# Patient Record
Sex: Female | Born: 1935 | Race: White | Hispanic: No | Smoking: Never smoker
Health system: Southern US, Community
[De-identification: ages and names within clinical notes are randomized; demographics above are authoritative.]

## PROBLEM LIST (undated history)

## (undated) DIAGNOSIS — I10 Essential (primary) hypertension: Secondary | ICD-10-CM

## (undated) DIAGNOSIS — F0280 Dementia in other diseases classified elsewhere without behavioral disturbance: Secondary | ICD-10-CM

## (undated) DIAGNOSIS — E785 Hyperlipidemia, unspecified: Secondary | ICD-10-CM

## (undated) DIAGNOSIS — G309 Alzheimer's disease, unspecified: Secondary | ICD-10-CM

## (undated) DIAGNOSIS — I4891 Unspecified atrial fibrillation: Secondary | ICD-10-CM

## (undated) HISTORY — PX: CORNEAL TRANSPLANT: SHX108

---

## 2013-05-29 ENCOUNTER — Encounter (INDEPENDENT_AMBULATORY_CARE_PROVIDER_SITE_OTHER): Payer: Medicare Other | Admitting: Ophthalmology

## 2013-05-29 ENCOUNTER — Encounter (INDEPENDENT_AMBULATORY_CARE_PROVIDER_SITE_OTHER): Payer: Self-pay | Admitting: Ophthalmology

## 2013-05-29 DIAGNOSIS — I10 Essential (primary) hypertension: Secondary | ICD-10-CM

## 2013-05-29 DIAGNOSIS — H35039 Hypertensive retinopathy, unspecified eye: Secondary | ICD-10-CM

## 2013-05-29 DIAGNOSIS — H1810 Bullous keratopathy, unspecified eye: Secondary | ICD-10-CM

## 2013-05-29 DIAGNOSIS — H43819 Vitreous degeneration, unspecified eye: Secondary | ICD-10-CM

## 2013-05-29 DIAGNOSIS — H3530 Unspecified macular degeneration: Secondary | ICD-10-CM

## 2013-06-17 ENCOUNTER — Inpatient Hospital Stay: Payer: Self-pay | Admitting: Specialist

## 2013-06-17 LAB — CBC WITH DIFFERENTIAL/PLATELET
Basophil #: 0.1 10*3/uL (ref 0.0–0.1)
Basophil %: 0.5 %
Eosinophil #: 0 10*3/uL (ref 0.0–0.7)
Eosinophil %: 0 %
HCT: 31.7 % — ABNORMAL LOW (ref 35.0–47.0)
HGB: 10.3 g/dL — AB (ref 12.0–16.0)
Lymphocyte #: 0.7 10*3/uL — ABNORMAL LOW (ref 1.0–3.6)
Lymphocyte %: 4.1 %
MCH: 34 pg (ref 26.0–34.0)
MCHC: 32.6 g/dL (ref 32.0–36.0)
MCV: 104 fL — AB (ref 80–100)
MONO ABS: 1.4 x10 3/mm — AB (ref 0.2–0.9)
Monocyte %: 8.1 %
Neutrophil #: 15.1 10*3/uL — ABNORMAL HIGH (ref 1.4–6.5)
Neutrophil %: 87.3 %
Platelet: 264 10*3/uL (ref 150–440)
RBC: 3.04 10*6/uL — AB (ref 3.80–5.20)
RDW: 21.6 % — ABNORMAL HIGH (ref 11.5–14.5)
WBC: 17.3 10*3/uL — ABNORMAL HIGH (ref 3.6–11.0)

## 2013-06-17 LAB — URINALYSIS, COMPLETE
BACTERIA: NONE SEEN
BILIRUBIN, UR: NEGATIVE
GLUCOSE, UR: NEGATIVE mg/dL (ref 0–75)
Hyaline Cast: 2
Ketone: NEGATIVE
Leukocyte Esterase: NEGATIVE
Nitrite: NEGATIVE
PH: 6 (ref 4.5–8.0)
Protein: 30
RBC,UR: 7 /HPF (ref 0–5)
Specific Gravity: 1.016 (ref 1.003–1.030)
Squamous Epithelial: <1

## 2013-06-17 LAB — COMPREHENSIVE METABOLIC PANEL
ALT: 9 U/L — AB (ref 12–78)
Albumin: 2.8 g/dL — ABNORMAL LOW (ref 3.4–5.0)
Alkaline Phosphatase: 46 U/L
Anion Gap: 5 — ABNORMAL LOW (ref 7–16)
BUN: 29 mg/dL — ABNORMAL HIGH (ref 7–18)
Bilirubin,Total: 1.3 mg/dL — ABNORMAL HIGH (ref 0.2–1.0)
Calcium, Total: 8.7 mg/dL (ref 8.5–10.1)
Chloride: 98 mmol/L (ref 98–107)
Co2: 33 mmol/L — ABNORMAL HIGH (ref 21–32)
Creatinine: 1.3 mg/dL (ref 0.60–1.30)
GFR CALC AF AMER: 40 — AB
GFR CALC NON AF AMER: 34 — AB
Glucose: 176 mg/dL — ABNORMAL HIGH (ref 65–99)
OSMOLALITY: 282 (ref 275–301)
Potassium: 3.3 mmol/L — ABNORMAL LOW (ref 3.5–5.1)
SGOT(AST): 16 U/L (ref 15–37)
Sodium: 136 mmol/L (ref 136–145)
Total Protein: 6.4 g/dL (ref 6.4–8.2)

## 2013-06-17 LAB — TSH: Thyroid Stimulating Horm: 1.6 u[IU]/mL

## 2013-06-17 LAB — HEMOGLOBIN A1C: Hemoglobin A1C: 6.6 % — ABNORMAL HIGH (ref 4.2–6.3)

## 2013-06-17 LAB — BILIRUBIN, DIRECT: BILIRUBIN DIRECT: 0.3 mg/dL — AB (ref 0.00–0.20)

## 2013-06-17 LAB — MAGNESIUM: MAGNESIUM: 1.9 mg/dL

## 2013-06-17 LAB — AMMONIA: Ammonia, Plasma: <10 mcmol/L (ref 11–32)

## 2013-06-17 LAB — TROPONIN I: Troponin-I: <0.02 ng/mL

## 2013-06-18 LAB — COMPREHENSIVE METABOLIC PANEL
ALBUMIN: 2.7 g/dL — AB (ref 3.4–5.0)
AST: 25 U/L (ref 15–37)
Alkaline Phosphatase: 46 U/L
Anion Gap: 5 — ABNORMAL LOW (ref 7–16)
BUN: 28 mg/dL — ABNORMAL HIGH (ref 7–18)
Bilirubin,Total: 1.2 mg/dL — ABNORMAL HIGH (ref 0.2–1.0)
CREATININE: 1.22 mg/dL (ref 0.60–1.30)
Calcium, Total: 8.6 mg/dL (ref 8.5–10.1)
Chloride: 103 mmol/L (ref 98–107)
Co2: 32 mmol/L (ref 21–32)
EGFR (Non-African Amer.): 37 — ABNORMAL LOW
GFR CALC AF AMER: 43 — AB
GLUCOSE: 119 mg/dL — AB (ref 65–99)
OSMOLALITY: 286 (ref 275–301)
Potassium: 3.9 mmol/L (ref 3.5–5.1)
SGPT (ALT): 13 U/L (ref 12–78)
Sodium: 140 mmol/L (ref 136–145)
Total Protein: 6.7 g/dL (ref 6.4–8.2)

## 2013-06-18 LAB — CBC WITH DIFFERENTIAL/PLATELET
Basophil #: 0.1 10*3/uL (ref 0.0–0.1)
Basophil %: 0.4 %
EOS PCT: 0.1 %
Eosinophil #: 0 10*3/uL (ref 0.0–0.7)
HCT: 32 % — ABNORMAL LOW (ref 35.0–47.0)
HGB: 10.7 g/dL — ABNORMAL LOW (ref 12.0–16.0)
LYMPHS PCT: 5.2 %
Lymphocyte #: 0.8 10*3/uL — ABNORMAL LOW (ref 1.0–3.6)
MCH: 35.2 pg — AB (ref 26.0–34.0)
MCHC: 33.4 g/dL (ref 32.0–36.0)
MCV: 105 fL — ABNORMAL HIGH (ref 80–100)
Monocyte #: 1.4 x10 3/mm — ABNORMAL HIGH (ref 0.2–0.9)
Monocyte %: 8.9 %
NEUTROS ABS: 13.2 10*3/uL — AB (ref 1.4–6.5)
Neutrophil %: 85.4 %
Platelet: 257 10*3/uL (ref 150–440)
RBC: 3.04 10*6/uL — ABNORMAL LOW (ref 3.80–5.20)
RDW: 21.3 % — ABNORMAL HIGH (ref 11.5–14.5)
WBC: 15.4 10*3/uL — ABNORMAL HIGH (ref 3.6–11.0)

## 2013-06-19 LAB — BASIC METABOLIC PANEL
Anion Gap: 3 — ABNORMAL LOW (ref 7–16)
BUN: 31 mg/dL — AB (ref 7–18)
Calcium, Total: 8.7 mg/dL (ref 8.5–10.1)
Chloride: 106 mmol/L (ref 98–107)
Co2: 32 mmol/L (ref 21–32)
Creatinine: 1.29 mg/dL (ref 0.60–1.30)
EGFR (Non-African Amer.): 34 — ABNORMAL LOW
GFR CALC AF AMER: 40 — AB
GLUCOSE: 103 mg/dL — AB (ref 65–99)
Osmolality: 288 (ref 275–301)
Potassium: 4.1 mmol/L (ref 3.5–5.1)
Sodium: 141 mmol/L (ref 136–145)

## 2013-06-19 LAB — CBC WITH DIFFERENTIAL/PLATELET
BASOS PCT: 0.5 %
Basophil #: 0.1 10*3/uL (ref 0.0–0.1)
EOS ABS: 0.1 10*3/uL (ref 0.0–0.7)
Eosinophil %: 0.7 %
HCT: 29 % — AB (ref 35.0–47.0)
HGB: 9.7 g/dL — ABNORMAL LOW (ref 12.0–16.0)
Lymphocyte #: 1.1 10*3/uL (ref 1.0–3.6)
Lymphocyte %: 10.3 %
MCH: 34.9 pg — ABNORMAL HIGH (ref 26.0–34.0)
MCHC: 33.3 g/dL (ref 32.0–36.0)
MCV: 105 fL — AB (ref 80–100)
Monocyte #: 1 x10 3/mm — ABNORMAL HIGH (ref 0.2–0.9)
Monocyte %: 9.5 %
Neutrophil #: 8.5 10*3/uL — ABNORMAL HIGH (ref 1.4–6.5)
Neutrophil %: 79 %
Platelet: 258 10*3/uL (ref 150–440)
RBC: 2.76 10*6/uL — ABNORMAL LOW (ref 3.80–5.20)
RDW: 20.7 % — AB (ref 11.5–14.5)
WBC: 10.8 10*3/uL (ref 3.6–11.0)

## 2013-06-19 LAB — URINE CULTURE

## 2013-06-22 LAB — CULTURE, BLOOD (SINGLE)

## 2013-09-19 ENCOUNTER — Emergency Department (HOSPITAL_COMMUNITY): Payer: Medicare Other

## 2013-09-19 ENCOUNTER — Encounter (HOSPITAL_COMMUNITY): Payer: Self-pay | Admitting: Emergency Medicine

## 2013-09-19 DIAGNOSIS — F09 Unspecified mental disorder due to known physiological condition: Secondary | ICD-10-CM | POA: Diagnosis present

## 2013-09-19 DIAGNOSIS — F0280 Dementia in other diseases classified elsewhere without behavioral disturbance: Secondary | ICD-10-CM | POA: Diagnosis present

## 2013-09-19 DIAGNOSIS — E785 Hyperlipidemia, unspecified: Secondary | ICD-10-CM | POA: Diagnosis present

## 2013-09-19 DIAGNOSIS — I4891 Unspecified atrial fibrillation: Secondary | ICD-10-CM | POA: Diagnosis present

## 2013-09-19 DIAGNOSIS — I359 Nonrheumatic aortic valve disorder, unspecified: Secondary | ICD-10-CM | POA: Diagnosis present

## 2013-09-19 DIAGNOSIS — I2109 ST elevation (STEMI) myocardial infarction involving other coronary artery of anterior wall: Secondary | ICD-10-CM | POA: Diagnosis not present

## 2013-09-19 DIAGNOSIS — Z515 Encounter for palliative care: Secondary | ICD-10-CM | POA: Diagnosis not present

## 2013-09-19 DIAGNOSIS — I5043 Acute on chronic combined systolic (congestive) and diastolic (congestive) heart failure: Secondary | ICD-10-CM | POA: Diagnosis present

## 2013-09-19 DIAGNOSIS — Z66 Do not resuscitate: Secondary | ICD-10-CM | POA: Diagnosis present

## 2013-09-19 DIAGNOSIS — I509 Heart failure, unspecified: Secondary | ICD-10-CM | POA: Diagnosis present

## 2013-09-19 DIAGNOSIS — F0390 Unspecified dementia without behavioral disturbance: Secondary | ICD-10-CM | POA: Diagnosis present

## 2013-09-19 DIAGNOSIS — I10 Essential (primary) hypertension: Secondary | ICD-10-CM | POA: Diagnosis present

## 2013-09-19 DIAGNOSIS — I255 Ischemic cardiomyopathy: Secondary | ICD-10-CM

## 2013-09-19 DIAGNOSIS — G309 Alzheimer's disease, unspecified: Secondary | ICD-10-CM | POA: Diagnosis present

## 2013-09-19 DIAGNOSIS — I2589 Other forms of chronic ischemic heart disease: Secondary | ICD-10-CM | POA: Diagnosis present

## 2013-09-19 DIAGNOSIS — G9340 Encephalopathy, unspecified: Secondary | ICD-10-CM | POA: Diagnosis present

## 2013-09-19 DIAGNOSIS — I2789 Other specified pulmonary heart diseases: Secondary | ICD-10-CM | POA: Diagnosis present

## 2013-09-19 HISTORY — DX: Essential (primary) hypertension: I10

## 2013-09-19 HISTORY — DX: Dementia in other diseases classified elsewhere, unspecified severity, without behavioral disturbance, psychotic disturbance, mood disturbance, and anxiety: F02.80

## 2013-09-19 HISTORY — DX: Unspecified atrial fibrillation: I48.91

## 2013-09-19 HISTORY — DX: Alzheimer's disease, unspecified: G30.9

## 2013-09-19 HISTORY — DX: Hyperlipidemia, unspecified: E78.5

## 2013-09-19 LAB — CBC WITH DIFFERENTIAL/PLATELET
BASOS PCT: 0 % (ref 0–1)
Basophils Absolute: 0 10*3/uL (ref 0.0–0.1)
EOS ABS: 0.2 10*3/uL (ref 0.0–0.7)
EOS PCT: 2 % (ref 0–5)
HCT: 28.7 % — ABNORMAL LOW (ref 36.0–46.0)
Hemoglobin: 9.5 g/dL — ABNORMAL LOW (ref 12.0–15.0)
Lymphocytes Relative: 12 % (ref 12–46)
Lymphs Abs: 1.3 10*3/uL (ref 0.7–4.0)
MCH: 33.9 pg (ref 26.0–34.0)
MCHC: 33.1 g/dL (ref 30.0–36.0)
MCV: 102.5 fL — ABNORMAL HIGH (ref 78.0–100.0)
Monocytes Absolute: 1.5 10*3/uL — ABNORMAL HIGH (ref 0.1–1.0)
Monocytes Relative: 14 % — ABNORMAL HIGH (ref 3–12)
Neutro Abs: 7.4 10*3/uL (ref 1.7–7.7)
Neutrophils Relative %: 72 % (ref 43–77)
PLATELETS: 337 10*3/uL (ref 150–400)
RBC: 2.8 MIL/uL — AB (ref 3.87–5.11)
RDW: 20.7 % — ABNORMAL HIGH (ref 11.5–15.5)
WBC: 10.4 10*3/uL (ref 4.0–10.5)

## 2013-09-19 LAB — I-STAT CHEM 8, ED
BUN: 28 mg/dL — ABNORMAL HIGH (ref 6–23)
CALCIUM ION: 1.12 mmol/L — AB (ref 1.13–1.30)
CREATININE: 1.4 mg/dL — AB (ref 0.50–1.10)
Chloride: 104 mEq/L (ref 96–112)
GLUCOSE: 136 mg/dL — AB (ref 70–99)
HCT: 30 % — ABNORMAL LOW (ref 36.0–46.0)
HEMOGLOBIN: 10.2 g/dL — AB (ref 12.0–15.0)
Potassium: 4.3 mEq/L (ref 3.7–5.3)
SODIUM: 136 meq/L — AB (ref 137–147)
TCO2: 29 mmol/L (ref 0–100)

## 2013-09-19 LAB — BASIC METABOLIC PANEL
Anion gap: 12 (ref 5–15)
BUN: 24 mg/dL — ABNORMAL HIGH (ref 6–23)
CALCIUM: 8.8 mg/dL (ref 8.4–10.5)
CO2: 25 mEq/L (ref 19–32)
Chloride: 101 mEq/L (ref 96–112)
Creatinine, Ser: 1.29 mg/dL — ABNORMAL HIGH (ref 0.50–1.10)
GFR calc Af Amer: 39 mL/min — ABNORMAL LOW (ref >90)
GFR, EST NON AFRICAN AMERICAN: 33 mL/min — AB (ref >90)
Glucose, Bld: 133 mg/dL — ABNORMAL HIGH (ref 70–99)
Potassium: 4.4 mEq/L (ref 3.7–5.3)
SODIUM: 138 meq/L (ref 137–147)

## 2013-09-19 LAB — TROPONIN I: TROPONIN I: 14.19 ng/mL — AB (ref <0.30)

## 2013-09-19 LAB — I-STAT TROPONIN, ED: Troponin i, poc: 0.02 ng/mL (ref 0.00–0.08)

## 2013-09-19 LAB — MRSA PCR SCREENING: MRSA by PCR: NEGATIVE

## 2013-09-19 LAB — PROTIME-INR
INR: 1.3 (ref 0.00–1.49)
Prothrombin Time: 16.2 seconds — ABNORMAL HIGH (ref 11.6–15.2)

## 2013-09-19 MED ORDER — ATORVASTATIN CALCIUM 10 MG PO TABS
10.0000 mg | ORAL_TABLET | Freq: Every day | ORAL | Status: DC
  Administered 2013-09-19 – 2013-09-20 (×2): 10 mg via ORAL
  Filled 2013-09-19 (×3): qty 1

## 2013-09-19 MED ORDER — CLOPIDOGREL BISULFATE 75 MG PO TABS
75.0000 mg | ORAL_TABLET | Freq: Every day | ORAL | Status: DC
  Administered 2013-09-20 – 2013-09-21 (×2): 75 mg via ORAL
  Filled 2013-09-19 (×3): qty 1

## 2013-09-19 MED ORDER — ASPIRIN 300 MG RE SUPP
300.0000 mg | RECTAL | Status: AC
  Filled 2013-09-19: qty 1

## 2013-09-19 MED ORDER — ONDANSETRON HCL 4 MG/2ML IJ SOLN
4.0000 mg | Freq: Four times a day (QID) | INTRAMUSCULAR | Status: DC | PRN
  Administered 2013-09-19: 4 mg via INTRAVENOUS
  Filled 2013-09-19: qty 2

## 2013-09-19 MED ORDER — LEVOTHYROXINE SODIUM 50 MCG PO TABS
50.0000 ug | ORAL_TABLET | Freq: Every day | ORAL | Status: DC
  Administered 2013-09-21: 50 ug via ORAL
  Filled 2013-09-19 (×3): qty 1

## 2013-09-19 MED ORDER — NITROGLYCERIN 0.4 MG SL SUBL: 0.4000 mg | SUBLINGUAL_TABLET | SUBLINGUAL | Status: DC | PRN

## 2013-09-19 MED ORDER — ASPIRIN 81 MG PO CHEW
324.0000 mg | CHEWABLE_TABLET | ORAL | Status: AC
  Administered 2013-09-19: 243 mg via ORAL
  Filled 2013-09-19 (×2): qty 4

## 2013-09-19 MED ORDER — ENOXAPARIN SODIUM 60 MG/0.6ML ~~LOC~~ SOLN
50.0000 mg | SUBCUTANEOUS | Status: DC
  Administered 2013-09-19 – 2013-09-21 (×3): 50 mg via SUBCUTANEOUS
  Filled 2013-09-19 (×3): qty 0.6

## 2013-09-19 MED ORDER — ONDANSETRON 4 MG PO TBDP: 4.0000 mg | ORAL_TABLET | Freq: Once | ORAL | Status: DC

## 2013-09-19 MED ORDER — ASPIRIN EC 81 MG PO TBEC
81.0000 mg | DELAYED_RELEASE_TABLET | Freq: Every day | ORAL | Status: DC
  Administered 2013-09-20 – 2013-09-21 (×2): 81 mg via ORAL
  Filled 2013-09-19 (×2): qty 1

## 2013-09-19 MED ORDER — CETYLPYRIDINIUM CHLORIDE 0.05 % MT LIQD
7.0000 mL | Freq: Two times a day (BID) | OROMUCOSAL | Status: DC
  Administered 2013-09-19 – 2013-09-21 (×3): 7 mL via OROMUCOSAL

## 2013-09-19 MED ORDER — MORPHINE SULFATE 2 MG/ML IJ SOLN
2.0000 mg | INTRAMUSCULAR | Status: AC | PRN
  Administered 2013-09-19 – 2013-09-20 (×3): 2 mg via INTRAVENOUS
  Filled 2013-09-19 (×4): qty 1

## 2013-09-19 MED ORDER — ACETAMINOPHEN 325 MG PO TABS: 650.0000 mg | ORAL_TABLET | ORAL | Status: DC | PRN

## 2013-09-19 MED ORDER — METOPROLOL TARTRATE 12.5 MG HALF TABLET
12.5000 mg | ORAL_TABLET | Freq: Two times a day (BID) | ORAL | Status: DC
  Administered 2013-09-19 – 2013-09-20 (×3): 12.5 mg via ORAL
  Filled 2013-09-19 (×6): qty 1

## 2013-09-19 MED ORDER — CLOPIDOGREL BISULFATE 300 MG PO TABS
300.0000 mg | ORAL_TABLET | Freq: Once | ORAL | Status: AC
  Administered 2013-09-19: 300 mg via ORAL
  Filled 2013-09-19: qty 1

## 2013-09-19 NOTE — ED Notes (Signed)
Dr. Excell Seltzer at the bedside talking family.

## 2013-09-19 NOTE — ED Notes (Addendum)
Pt to department via EMS from [facility]- Pt reports that she started having midsternal chest pain this morning. Rates the pain 10/10. Pt had 1 ASA this morning prior to EMS arrival. Pt given 1 nitro en route. Pt with hx of a-fib. Bp-131/65 Hr-60 18g LAC Pt given of fentanyl

## 2013-09-19 NOTE — Progress Notes (Signed)
ANTICOAGULATION CONSULT NOTE - Initial Consult  Pharmacy Consult for Lovenox Indication: chest pain/ACS  Allergies  Allergen Reactions  . Codeine Other (See Comments)    Unknown; listed on [facility]    Patient Measurements: Weight: 116 lb 6.5 oz (52.8 kg)  Vital Signs: Temp: 97.7 F (36.5 C) (08/29 1320) Temp src: Oral (08/29 1320) BP: 138/63 mmHg (08/29 1320) Pulse Rate: 93 (08/29 1320)  Labs:  Recent Labs  10/11/13 1143 2013-10-11 1152  HGB 9.5* 10.2*  HCT 28.7* 30.0*  PLT 337  --   LABPROT 16.2*  --   INR 1.30  --   CREATININE 1.29* 1.40*    CrCl is unknown because there is no height on file for the current visit.   Medical History: Past Medical History  Diagnosis Date  . Atrial fibrillation   . Alzheimer disease   . Hyperlipemia   . Hypertension     Medications:  Scheduled:  . aspirin  324 mg Oral NOW   Or  . aspirin  300 mg Rectal NOW  . [START ON 09/20/2013] aspirin EC  81 mg Oral Daily  . atorvastatin  10 mg Oral q1800  . clopidogrel  300 mg Oral Once  . [START ON 09/20/2013] clopidogrel  75 mg Oral Q breakfast  . metoprolol tartrate  12.5 mg Oral BID  . ondansetron  4 mg Oral Once   Assessment: 80 yo F presenting on 8/29 with CP and not appropriate candidate for cath. She is now to start therapeutic Lovenox. Weight 52.8 kg, SCr 1.40 (CrCl ~19 ml/min), H/H low, plt wnl, no reported s/s bleeding.   Goal of Therapy:  Anti-Xa level 0.6-1 units/ml 4hrs after LMWH dose given Monitor platelets by anticoagulation protocol: Yes   Plan:  - Start Lovenox 50 mg (1mg /kg) SQ q24h (reduced dose due to renal function) - Monitor renal function, CBC, s/s bleeding  Margie Billet, PharmD Clinical Pharmacist - Resident Pager: [phone] Pharmacy: [phone] 10/11/2013 1:34 PM

## 2013-09-19 NOTE — Progress Notes (Signed)
CRITICAL VALUE ALERT  Critical value received:  Troponin  Date of notification:  09/11/2013  Time of notification:  1950  Critical value read back:Yes.    Nurse who received alert:  RG  MD notified (1st page):  Dayle Points.   Time of first page:  2000  MD notified (2nd page):  Time of second page:  Responding MD:   Time MD responded:

## 2013-09-19 NOTE — Progress Notes (Signed)
Brief X-Cover Note  Notified of Troponin of 14. Reviewed chart. Autumn Stone is currently hemodynamically stable. BP 125/84  Pulse 77  Temp(Src) 97.6 F (36.4 C) (Oral)  Resp 24  Ht 5\' 5"  (1.651 m)  Wt 52.8 kg (116 lb 6.5 oz)  BMI 19.37 kg/m2  SpO2 94%. She is being treated medically for STEMI given advanced age/dementia/high risk for procedures. She continues on aspirin, plavix, metoprolol, lovenox. She is DNAR.   Leeann Must, MD

## 2013-09-19 NOTE — ED Notes (Signed)
Dr. Excell Seltzer talked with son. No cath at this time per son who is the POA. Waiting for family.

## 2013-09-19 NOTE — H&P (Signed)
History and Physical  Patient ID: Autumn Stone MRN: [id number], SOB: 30-Nov-1912 80 y.o. Date of Encounter: 08/27/2013, 12:38 PM  Primary Physician: No primary provider on file. Primary Cardiologist: none  Chief Complaint: Chest pain  HPI: 80 y.o. female w/ PMHx significant for HTN and CHF who presented to [facility] on 09/13/2013 with complaints of chest pain. She lives in a SNF and complained of chest pain x about 1-2 hours. EMS was called and a Code STEMI was activated from the field. I am evaluating the patient in the Emergency Department.  She states 'I don't feel good' and complains of pain in the center of her chest and left arm. No shortness of breath, nausea, vomiting. No other clear complaints. History obtained from patient is limited because of baseline cognitive dysfunction.  I spoke to her son who provides more details of her history. The patient is widowed since 2004. She lives in a SNF in [place] and has significant baseline dementia. She ambulates with a walker, but is very sedentary. The patient is a DNR.  Past Medical History  Diagnosis Date  . Atrial fibrillation   . Alzheimer disease   . Hyperlipemia   . Hypertension      Surgical History:  Past Surgical History  Procedure Laterality Date  . Corneal transplant       Home Meds: Prior to Admission medications   Not on File    Allergies:  Allergies  Allergen Reactions  . Codeine     History   Social History  . Marital Status: Widowed    Spouse Name: N/A    Number of Children: N/A  . Years of Education: N/A   Occupational History  . Not on file.   Social History Main Topics  . Smoking status: Never Smoker   . Smokeless tobacco: Not on file  . Alcohol Use: Not on file  . Drug Use: Not on file  . Sexual Activity: Not on file   Other Topics Concern  . Not on file   Social History Narrative   Lives in [place]. Widow.     Family history: no premature CAD  Review of  Systems: Unable to obtain accurate ROS from patient because of baseline dementia. In addition to HPI, positives include gait instability and urinary incontinence.  Physical Exam: Blood pressure 125/72, pulse 62, temperature 97.5 F (36.4 C), temperature source Oral, SpO2 94.00%. General: Elderly woman, oriented to person only, in no acute distress. HEENT: Normocephalic, atraumatic, sclera non-icteric, no xanthomas, nares are without discharge.  Neck: Supple. Carotids 2+ without bruits. JVP normal Lungs: Clear bilaterally to auscultation without wheezes, rales, or rhonchi. Breathing is unlabored. Heart: RRR with normal S1 and S2. No murmurs, rubs, or gallops appreciated. Abdomen: Soft, non-tender, non-distended with normoactive bowel sounds.  Back: No CVA tenderness Msk:  Strength and tone appear normal for age. Extremities: No clubbing, cyanosis, or edema.   Neuro: CNII-XII intact, moves all extremities spontaneously. Psych:  Awake, alert, pleasantly confused   Labs:   Lab Results  Component Value Date   WBC 10.4 08/26/2013   HGB 10.2* 09/18/2013   HCT 30.0* 08/22/2013   MCV 102.5* 09/18/2013   PLT 337 09/09/2013     Recent Labs Lab 08/30/2013 1152  NA 136*  K 4.3  CL 104  BUN 28*  CREATININE 1.40*  GLUCOSE 136*   No results found for this basename: CKTOTAL, CKMB, TROPONINI,  in the last 72 hours No results found for this  basename: CHOL,  HDL,  LDLCALC,  TRIG   No results found for this basename: DDIMER    Radiology/Studies:  CXR: IMPRESSION:  1. Lower inspiratory volumes with slight interval progression of  left basilar opacity likely representing a small effusion and  associated atelectasis.  2. Stable cardiomegaly.  3. Aortic atherosclerosis.  4. Pulmonary vascular congestion without overt edema.   EKG: atrial fibrillation with controlled ventricular response, 64 bpm, anteroseptal MI consider acute STEMI  ASSESSMENT AND PLAN:  1. Chest pain possible anterior  STEMI. EKG is somewhat indeterminate. For an appropriate patient, would proceed with emergent cath/PCI. However, in this very elderly woman I do not think invasive evaluation/treatment is indicated. I spoke with her son at length, and the patient is a DNR who has signs of progressive dementia. Plan medical therapy with lovenox, ASA, plavix, low-dose statin, and beta-blocker. Admit Stepdown bed. Trend cardiac enzymes. Check 2D echo. If she becomes unstable, would plan on palliative approach to her care.   2. Atrial fibrillation with controlled ventricular response. Continue with rate-control, ASA, plavix. She is not a candidate for anticoagulation.  3. Dementia  4. HTN - titrate beta-blocker. Consider add ACE depending on BP trend.   5. Code Status - DNR. Confirmed with son who is Chief Technology Officer.  Signed, Tonny Bollman MD 09/05/2013, 12:38 PM

## 2013-09-19 NOTE — ED Notes (Signed)
Cardiologist at the bedside

## 2013-09-20 DIAGNOSIS — R072 Precordial pain: Secondary | ICD-10-CM

## 2013-09-20 DIAGNOSIS — F0390 Unspecified dementia without behavioral disturbance: Secondary | ICD-10-CM

## 2013-09-20 DIAGNOSIS — I219 Acute myocardial infarction, unspecified: Secondary | ICD-10-CM

## 2013-09-20 DIAGNOSIS — I10 Essential (primary) hypertension: Secondary | ICD-10-CM

## 2013-09-20 LAB — BASIC METABOLIC PANEL
Anion gap: 16 — ABNORMAL HIGH (ref 5–15)
BUN: 26 mg/dL — ABNORMAL HIGH (ref 6–23)
CALCIUM: 9.6 mg/dL (ref 8.4–10.5)
CO2: 22 mEq/L (ref 19–32)
Chloride: 98 mEq/L (ref 96–112)
Creatinine, Ser: 1.31 mg/dL — ABNORMAL HIGH (ref 0.50–1.10)
GFR calc non Af Amer: 33 mL/min — ABNORMAL LOW (ref >90)
GFR, EST AFRICAN AMERICAN: 38 mL/min — AB (ref >90)
GLUCOSE: 154 mg/dL — AB (ref 70–99)
Potassium: 5.1 mEq/L (ref 3.7–5.3)
SODIUM: 136 meq/L — AB (ref 137–147)

## 2013-09-20 LAB — TROPONIN I

## 2013-09-20 LAB — LIPID PANEL
Cholesterol: 182 mg/dL (ref 0–200)
HDL: 41 mg/dL (ref >39)
LDL Cholesterol: 120 mg/dL — ABNORMAL HIGH (ref 0–99)
Total CHOL/HDL Ratio: 4.4 RATIO
Triglycerides: 105 mg/dL (ref <150)
VLDL: 21 mg/dL (ref 0–40)

## 2013-09-20 LAB — CBC
HCT: 31.5 % — ABNORMAL LOW (ref 36.0–46.0)
Hemoglobin: 10.3 g/dL — ABNORMAL LOW (ref 12.0–15.0)
MCH: 33.4 pg (ref 26.0–34.0)
MCHC: 32.7 g/dL (ref 30.0–36.0)
MCV: 102.3 fL — AB (ref 78.0–100.0)
PLATELETS: 366 10*3/uL (ref 150–400)
RBC: 3.08 MIL/uL — ABNORMAL LOW (ref 3.87–5.11)
RDW: 20.9 % — ABNORMAL HIGH (ref 11.5–15.5)
WBC: 16.4 10*3/uL — ABNORMAL HIGH (ref 4.0–10.5)

## 2013-09-20 MED ORDER — FUROSEMIDE 10 MG/ML IJ SOLN
40.0000 mg | Freq: Once | INTRAMUSCULAR | Status: AC
  Administered 2013-09-20: 40 mg via INTRAVENOUS
  Filled 2013-09-20: qty 4

## 2013-09-20 MED ORDER — MORPHINE SULFATE 2 MG/ML IJ SOLN
2.0000 mg | INTRAMUSCULAR | Status: AC | PRN
  Administered 2013-09-20 (×3): 2 mg via INTRAVENOUS
  Filled 2013-09-20 (×3): qty 1

## 2013-09-20 NOTE — Progress Notes (Signed)
  Echocardiogram 2D Echocardiogram has been performed.  Cathie Beams 09/20/2013, 3:19 PM

## 2013-09-20 NOTE — Progress Notes (Signed)
UR Completed.  Autumn Stone [phone] 09/20/2013  

## 2013-09-20 NOTE — Progress Notes (Signed)
Patient Name: Autumn Stone Date of Encounter: 09/20/2013  Active Problems:   Acute anterior wall MI   Hypertension   Dementia   Length of Stay: 1  SUBJECTIVE  The patient is difficult to arouse. The family states that this is a significant change for her. She didn't recognize some family members today.   CURRENT MEDS . antiseptic oral rinse  7 mL Mouth Rinse BID  . aspirin EC  81 mg Oral Daily  . atorvastatin  10 mg Oral q1800  . clopidogrel  75 mg Oral Q breakfast  . enoxaparin (LOVENOX) injection  50 mg Subcutaneous Q24H  . levothyroxine  50 mcg Oral QAC breakfast  . metoprolol tartrate  12.5 mg Oral BID  . ondansetron  4 mg Oral Once   OBJECTIVE  Filed Vitals:   09/20/13 0421 09/20/13 0423 09/20/13 0800 09/20/13 0902  BP:  141/84  135/86  Pulse:  97  108  Temp:  96.9 F (36.1 C) 97.5 F (36.4 C)   TempSrc:  Axillary Axillary   Resp:  26    Height:      Weight: 116 lb 13.5 oz (53 kg)     SpO2:  98%      Intake/Output Summary (Last 24 hours) at 09/20/13 1208 Last data filed at 09/20/13 0900  Gross per 24 hour  Intake    480 ml  Output    325 ml  Net    155 ml   Filed Weights   09/13/2013 1319 09/20/13 0421  Weight: 116 lb 6.5 oz (52.8 kg) 116 lb 13.5 oz (53 kg)   PHYSICAL EXAM  General: Pleasant, NAD. Neuro: Alert and oriented X 3. Moves all extremities spontaneously. Psych: Normal affect. HEENT:  Normal  Neck: Supple without bruits or JVD. Lungs:  Resp regular and unlabored, crackles at the basis Heart: RRR no s3, s4, or murmurs. Abdomen: Soft, non-tender, non-distended, BS + x 4.  Extremities: No clubbing, cyanosis or edema. DP/PT/Radials 2+ and equal bilaterally.  CBC  Recent Labs  09/17/2013 1143 08/28/2013 1152 09/20/13 0037  WBC 10.4  --  16.4*  NEUTROABS 7.4  --   --   HGB 9.5* 10.2* 10.3*  HCT 28.7* 30.0* 31.5*  MCV 102.5*  --  102.3*  PLT 337  --  366   Basic Metabolic Panel  Recent Labs  09/18/2013 1143 09/20/2013 1152  09/20/13 0037  NA 138 136* 136*  K 4.4 4.3 5.1  CL 101 104 98  CO2 25  --  22  GLUCOSE 133* 136* 154*  BUN 24* 28* 26*  CREATININE 1.29* 1.40* 1.31*  CALCIUM 8.8  --  9.6    Recent Labs  09/11/2013 1143 09/11/2013 1840 09/20/13 0037  TROPONINI <0.30 14.19* >20.00*    Recent Labs  09/20/13 0037  CHOL 182  HDL 41  LDLCALC 120*  TRIG 105  CHOLHDL 4.4   Radiology/Studies  Dg Chest Port 1 View  09/18/2013   CLINICAL DATA: STEMI   IMPRESSION: 1. Lower inspiratory volumes with slight interval progression of left basilar opacity likely representing a small effusion and associated atelectasis. 2. Stable cardiomegaly. 3. Aortic atherosclerosis. 4. Pulmonary vascular congestion without overt edema.    TELE: atrial fibrillation, 90-105 BPM  ECG: A-fib, anterior MI with STE, possibly an aneurysm, inferior MI, age undetermined    ASSESSMENT AND PLAN  1. Chest pain, anterior STEMI. EKG is somewhat indeterminate. Dr Excell Seltzer: "For an appropriate patient, would proceed with emergent cath/PCI. However, in this very  elderly woman I do not think invasive evaluation/treatment is indicated. I spoke with her son at length, and the patient is a DNR who has signs of progressive dementia. Plan medical therapy with lovenox, ASA, plavix, low-dose statin, and beta-blocker." Her cognitive function was mildly impaired at baseline and it deteriorated overnight. We will follow but family agrees that palliative care might be the best option for her as she didn't wish to prolong her care if the quality of life is poor.   2. CHF - we will give a dose of iv lasix and follow renal function  3. Atrial fibrillation with controlled ventricular response. Continue with rate-control, ASA, plavix. She is not a candidate for anticoagulation.   4. Dementia - as above  5. HTN - titrate beta-blocker. Consider add ACE depending on BP trend.   6. Code Status - DNR. Confirmed with son who is Theatre stage manager.  Signed, Lars Masson MD, [facility] 09/20/2013

## 2013-09-21 DIAGNOSIS — Z515 Encounter for palliative care: Secondary | ICD-10-CM

## 2013-09-21 DIAGNOSIS — I4891 Unspecified atrial fibrillation: Secondary | ICD-10-CM

## 2013-09-21 DIAGNOSIS — I2589 Other forms of chronic ischemic heart disease: Secondary | ICD-10-CM

## 2013-09-21 MED ORDER — MORPHINE SULFATE 2 MG/ML IJ SOLN: 1.0000 mg | Freq: Once | INTRAMUSCULAR | Status: DC

## 2013-09-21 MED ORDER — CARVEDILOL 3.125 MG PO TABS
3.1250 mg | ORAL_TABLET | Freq: Two times a day (BID) | ORAL | Status: DC
  Filled 2013-09-21 (×2): qty 1

## 2013-09-21 MED ORDER — MORPHINE SULFATE 2 MG/ML IJ SOLN
2.0000 mg | INTRAMUSCULAR | Status: DC | PRN
  Administered 2013-09-21: 2 mg via INTRAVENOUS
  Filled 2013-09-21: qty 1

## 2013-09-21 MED ORDER — MORPHINE SULFATE 2 MG/ML IJ SOLN
2.0000 mg | Freq: Once | INTRAMUSCULAR | Status: AC
  Administered 2013-09-21: 2 mg via INTRAVENOUS

## 2013-09-21 MED ORDER — ACETAMINOPHEN 650 MG RE SUPP: 650.0000 mg | RECTAL | Status: DC | PRN

## 2013-09-21 MED ORDER — MORPHINE SULFATE 2 MG/ML IJ SOLN
INTRAMUSCULAR | Status: AC
  Filled 2013-09-21: qty 1

## 2013-09-21 MED ORDER — LORAZEPAM 2 MG/ML IJ SOLN: 0.5000 mg | INTRAMUSCULAR | Status: DC | PRN

## 2013-09-21 MED ORDER — LISINOPRIL 2.5 MG PO TABS
2.5000 mg | ORAL_TABLET | Freq: Every day | ORAL | Status: DC
  Filled 2013-09-21: qty 1

## 2013-09-21 MED ORDER — LORAZEPAM 2 MG/ML IJ SOLN
0.2500 mg | INTRAMUSCULAR | Status: DC | PRN
  Administered 2013-09-21: 0.25 mg via INTRAVENOUS

## 2013-09-21 MED ORDER — LORAZEPAM 2 MG/ML IJ SOLN
INTRAMUSCULAR | Status: AC
  Filled 2013-09-21: qty 1

## 2013-09-22 ENCOUNTER — Telehealth: Payer: Self-pay | Admitting: Cardiovascular Disease

## 2013-09-22 NOTE — Progress Notes (Signed)
Patient provided post mortem care, [facility] at bedside and security to take patient downstairs. Family has no further questions, thankful for care provided.

## 2013-09-22 NOTE — Clinical Social Work Psychosocial (Signed)
Clinical Social Work Department BRIEF PSYCHOSOCIAL ASSESSMENT 09/03/2013  Patient:  Autumn Stone, Autumn Stone     Account Number:  [id number]     Admit date:  09/20/2013  Clinical Social Worker:  Merlyn Lot, CLINICAL SOCIAL WORKER  Date/Time:  08/22/2013 12:13 PM  Referred by:  Physician  Date Referred:  08/23/2013 Referred for  SNF Placement   Other Referral:   Interview type:  Family Other interview type:    PSYCHOSOCIAL DATA Living Status:  FACILITY Admitted from facility:  Other Level of care:  Assisted Living Primary support name:  Gargi Gallarzo Primary support relationship to patient:  CHILD, ADULT Degree of support available:   Patient had high level of support with five people at bedside.    CURRENT CONCERNS Current Concerns  Post-Acute Placement   Other Concerns:    SOCIAL WORK ASSESSMENT / PLAN CSW spoke with family and found that patient was from [facility] assited living.  CSW informed family that we would keep the assited living facility informed and CSW would assist if the patient was able to return to the facility.   Assessment/plan status:  Psychosocial Support/Ongoing Assessment of Needs Other assessment/ plan:   FL2 update   Information/referral to community resources:   [facility] assisted living    PATIENT'S/FAMILY'S RESPONSE TO PLAN OF CARE: Patients family was hopeful that patient would be able to return to assisted living but realistic about the patients prognosis and likely decline in hospital.       Merlyn Lot, [facility] Clinical Social Worker [phone]

## 2013-09-22 NOTE — Progress Notes (Deleted)
Pt complaining of abdominal pain 8/10. Paged and updated Dr.Sullivan of pt status. New orders received. Will continue to monitor pt.

## 2013-09-22 NOTE — Progress Notes (Signed)
Patient ID:Autumn Stone      DOB: 06/11/1912      YCX:[id number]  Arrived to find patient panting respiratory rate 44 , Sats ok 93  % diaphoretic.  Family ok with readministration of Morphine.  Will ok 2 mg morphine and talk with family  About further goals of care.  Khaleel Beckom L. Ladona Ridgel, MD MBA The Palliative Medicine Team at [facility] Phone: [phone] Pager: [phone] ( Use team phone after hours)

## 2013-09-22 NOTE — Progress Notes (Signed)
CRITICAL CARE Performed by: Excell Seltzer   Total critical care time: Bonnee Quin  Critical care time was exclusive of separately billable procedures and treating other patients.  Critical care was necessary to treat or prevent imminent or life-threatening deterioration.  Critical care was time spent personally by me on the following activities: development of treatment plan with patient and/or surrogate as well as nursing, discussions with consultants, evaluation of patient's response to treatment, examination of patient, obtaining history from patient or surrogate, ordering and performing treatments and interventions, ordering and review of laboratory studies, ordering and review of radiographic studies, pulse oximetry and re-evaluation of patient's condition.

## 2013-09-22 NOTE — Progress Notes (Signed)
Patient ID:Autumn Stone      DOB: 31-May-1912      WCH:[id number]   Full note to follow:  Met with family:  Son Rush Landmark and his wife Izora Gala, there daughters Suezanne Jacquet and Magda Paganini  Daughter Kennyth Lose- spouse Darnelle Maffucci and daughter Shirlean Mylar  Family are clear that they understand that her condition can not be cured and so they are focusing on comfort care.  They don't want her to suffer and so have expressed that they understand that she may be actively dying.  We discussed uses of medications for pain, dyspnea and anxiety. Family ok with whatever needs to be done to make her comfortable.   Patient grimacing, panting and diaphoretic.  Cool cloth applied. Morphine 2 mg given during our visit, and a low dose of ativan.   Time 415 pm- 500 pm  Aimee Timmons L. Lovena Le, MD MBA The Palliative Medicine Team at [facility] Phone: [phone] Pager: [phone] ( Use team phone after hours)

## 2013-09-22 NOTE — Progress Notes (Signed)
Chaplain responded to call from 2C to support family of pt who just passed. Found pt's grandson talking with Dr. Ladona Ridgel in [facility] and took him to join rest of family in [facility] conference room. Waited outside conf room to be available to family but give them privacy. Coordinated with pt's nurse. When she completed prepping pt, I escorted family to pt's room to say their goodbyes.

## 2013-09-22 NOTE — Progress Notes (Signed)
Subjective:  Apparently has not spoken since yesterday; withdrawn  Objective:   Vital Signs in the last 24 hours: Temp:  [97.6 F (36.4 C)-98.5 F (36.9 C)] 98 F (36.7 C) (08/31 0418) Pulse Rate:  [89-108] 103 (08/31 0418) Resp:  [24-35] 35 (08/31 0418) BP: (99-135)/(58-86) 112/69 mmHg (08/31 0418) SpO2:  [97 %-100 %] 97 % (08/31 0418) Weight:  [112 lb 7 oz (51 kg)] 112 lb 7 oz (51 kg) (08/31 0418)  Intake/Output from previous day: 08/30 0701 - 08/31 0700 In: 60 [P.O.:60] Out: -   Medications: . antiseptic oral rinse  7 mL Mouth Rinse BID  . aspirin EC  81 mg Oral Daily  . atorvastatin  10 mg Oral q1800  . clopidogrel  75 mg Oral Q breakfast  . enoxaparin (LOVENOX) injection  50 mg Subcutaneous Q24H  . levothyroxine  50 mcg Oral QAC breakfast  . metoprolol tartrate  12.5 mg Oral BID  . ondansetron  4 mg Oral Once       Physical Exam:   General appearance: not alert Neck: JVD - 8 cm above sternal notch, no adenopathy, no carotid bruit, no JVD, supple, symmetrical, trachea midline and thyroid not enlarged, symmetric, no tenderness/mass/nodules Lungs: decreased BS Heart: irregularly irregular rhythm and 1/6 sem Abdomen: soft, non-tender; bowel sounds normal; no masses,  no organomegaly Extremities: venous stasis dermatitis noted Neurologic: not alert; no focality   Rate: 115  Rhythm: atrial fibrillation  Lab Results:   Recent Labs  2013-10-14 1143 10-14-2013 1152 09/20/13 0037  NA 138 136* 136*  K 4.4 4.3 5.1  CL 101 104 98  CO2 25  --  22  GLUCOSE 133* 136* 154*  BUN 24* 28* 26*  CREATININE 1.29* 1.40* 1.31*     Recent Labs  10-14-13 1840 09/20/13 0037  TROPONINI 14.19* >20.00*    Hepatic Function Panel No results found for this basename: PROT, ALBUMIN, AST, ALT, ALKPHOS, BILITOT, BILIDIR, IBILI,  in the last 72 hours  Recent Labs  October 14, 2013 1143  INR 1.30   BNP (last 3 results) No results found for this basename: PROBNP,  in the last  8760 hours  Lipid Panel     Component Value Date/Time   CHOL 182 09/20/2013 0037   TRIG 105 09/20/2013 0037   HDL 41 09/20/2013 0037   CHOLHDL 4.4 09/20/2013 0037   VLDL 21 09/20/2013 0037   LDLCALC 120* 09/20/2013 0037      Imaging:  Dg Chest Port 1 View  10-14-2013   CLINICAL DATA:  Could STEMI  EXAM: PORTABLE CHEST - 1 VIEW  COMPARISON:  Prior chest x-ray 06/17/2013  FINDINGS: Stable cardiomegaly. Atherosclerotic calcifications present in the transverse aorta. Mild vascular congestion without overt edema appears similar compared to prior. Left basilar airspace opacity slightly progressed compared to 06/17/2013. Inspiratory volumes are slightly lower. No acute osseous abnormality. No pneumothorax. Stable central bronchitic changes. No acute osseous abnormality.  IMPRESSION: 1. Lower inspiratory volumes with slight interval progression of left basilar opacity likely representing a small effusion and associated atelectasis. 2. Stable cardiomegaly. 3. Aortic atherosclerosis. 4. Pulmonary vascular congestion without overt edema.   Electronically Signed   By: Malachy Moan M.D.   On: 10-14-13 12:21    Echo Study Conclusions  - Left ventricle: The cavity size was normal. Wall thickness was increased in a pattern of mild LVH. Systolic function was severely reduced. LVEF is 10-15% Doppler parameters are consistent with restrictive physiology, indicative of decreased left ventricular diastolic compliance and/or  increased left atrial pressure. - Regional wall motion abnormality: Akinesis of the mid-apical anterior, mid anteroseptal, mid anterolateral, apical septal, apical lateral, and apical myocardium; hypokinesis of the basal anteroseptal myocardium. There is apical akinesis without clear evidence of thrombus. - Aortic valve: Moderately to severely calcified annulus. Trileaflet; moderately thickened leaflets. There was mild regurgitation. There is severe low flow low gradient  aortic stenosis. Mean gradient (S): 17 mm Hg. Valve area (VTI): 0.41 cm^2. Valve area (Vmax): 0.5 cm^2. Valve area (Vmean): 0.49 cm^2. Regurgitation pressure half-time: 559 ms. - Mitral valve: Moderately calcified annulus. Mildly thickened leaflets . There was moderate regurgitation. The MR vena contracta is 0.4 cm . The MR appears to be at least partially due to tethering of the posteior leaflet due to LV hypokinesis. - Left atrium: The atrium was severely dilated. - Right ventricle: The cavity size was moderately dilated, it shares the apex with the LV. Systolic function was moderately reduced. - Right atrium: The atrium was severely dilated. - Tricuspid valve: There was moderate-severe regurgitation. - Pulmonary arteries: Systolic pressure was severely increased. PA peak pressure: 78 mm Hg (S). - Inferior vena cava: The vessel was dilated. The respirophasic diameter changes were blunted (< 50%), consistent with elevated central venous pressure. - Pericardium, extracardiac: There is a large left pleural effusion. There is a small circumferential pericardial effusion.    Assessment/Plan:   Active Problems:   Acute anterior wall MI   Hypertension   Dementia  1. Anterior STEMI.  Troponin >20. On medical therapy without invasive approach. Patient is a DNR with progressive dementia. Plan medical therapy with lovenox, ASA, plavix, low-dose statin, and beta-blocker."  Her cognitive function was mildly impaired at baseline and it deteriorated overnight.  We will follow but family agrees that palliative care might be the best option for her as she didn't wish to prolong her care if the quality of life is poor.  2. Ischemic cardiomyopathy -  EF 10 - 15% on echo with restrictive physiology. Cr stable will add lisinopril at 2.5 mg to regimen; will change lopressor to carvedilol 3.125 mg bid 3. Atrial fibrillation with controlled ventricular response. Continue with rate-control, ASA, plavix.  She is not a candidate for anticoagulation.  4. Severe Pulm HTN; PA 78 mmHg 5. Dementia - as above  6. Code Status - DNR. Confirmed with son who is Chief Technology Officer. No aggressive measure. According to son patient is ready to die. Continue MS for comfort support.     Lennette Bihari, MD, [facility] 09/16/2013, 8:02 AM

## 2013-09-22 NOTE — Consult Note (Signed)
Patient ID:Autumn Stone      DOB: 1912/05/19      ZOX:[id number]     Consult Note from the Palliative Medicine Team at [facility]    Consult Requested by: Dr. Tresa Endo     PCP: No primary provider on file. Reason for Consultation: Goals of Care    Phone Number:None Symptom management Assessment of patients Current state: 80 yr old white female with known history of dementia and hypertension was admitted with altered mentation and complaints of chest pain. Patient was found to have a EKG changes consistent with possible STEMI, and Afib with RVR.  Patient was deemed a poor candidate for invasive intervention and so she was admitted to stepdown for medical management and observation of cardiac markers.  I arrived to meet with the family at the request of cardiology as they had elicited a comfort care plan from the family.   On arrival, the patient was notably in distress with diaphoresis, furrowed brow, cold and clammy, tachypnea at respiration rate of 44, hypotensive with SBP 88, afib with mild rate increase in low 100's .  Family was distressed as they felt that she was in pain.  Permission was obtained to treat her with Morphine which she had received 2 hours earlier and tolerated well. She had been resting when I passed by earlier around 1 pm.  It became apparent during my visit that this patient was actively dying.  I updated the family and they again reiterated that they wanted to honor her by promoting comfort and dignity at this time in her life.  They were visibly grieved at their impending loss.  During our conversation, the patient further decompensated and developed worsening cyanosis, tachypnea and evidence for cardiogenic shock. She was provided a small dose of ativan for anxiety and the initial dose of morphine.  She developed PEA on monitor . Family was counseled at the beside regarding the events unfolding.  The patient was noted to be pulseless and apneic which was permitted to further  unfold as PEA was evident on the monitor. Recheck confirmed via two practitioner that time of death was 5 15 pm.  Cardiology was updated on events, and the chaplain was summoned to assist family in their grief.    Goals of Care: 1.  Code Status: DNR , Comfort measures   2. Scope of Treatment: Family expressed desire for full comfort.  4. Disposition: hospital death   3. Symptom Management:   1. Anxiety/Agitation: Ativan 0.25 mg x 1 2. Pain: Morphine 2 mg IV q 2 hrs prn.  4. Psychosocial: Family described 'Autumn Stone' as a dignified, strong woman.  She was well loved and well represented by her grandchildren and children at the time of her death.  5. Spiritual: Autumn Stone, Chaplain called.        Patient Documents Completed or Given: Document Given Completed  Advanced Directives Pkt    MOST    DNR    Gone from My Sight    Hard Choices      Brief HPI: 80 yr old white female admitted with chest pain.  Family stated "she stated she was ready to go".  We were asked to assist with goals of care and symptom management   ROS: unable to obtain due to patient not verbal at this time.      PMH:  Past Medical History  Diagnosis Date  . Atrial fibrillation   . Alzheimer disease   . Hyperlipemia   . Hypertension  PSH: Past Surgical History  Procedure Laterality Date  . Corneal transplant     I have reviewed the FH and SH and  If appropriate update it with new information. Allergies  Allergen Reactions  . Codeine Other (See Comments)    Unknown; listed on MAR   Scheduled Meds: . antiseptic oral rinse  7 mL Mouth Rinse BID  . aspirin EC  81 mg Oral Daily  . atorvastatin  10 mg Oral q1800  . carvedilol  3.125 mg Oral BID WC  . clopidogrel  75 mg Oral Q breakfast  . enoxaparin (LOVENOX) injection  50 mg Subcutaneous Q24H  . levothyroxine  50 mcg Oral QAC breakfast  . lisinopril  2.5 mg Oral Daily  . LORazepam      . morphine      . ondansetron  4 mg Oral  Once   Continuous Infusions:  PRN Meds:.acetaminophen, acetaminophen, LORazepam, morphine injection, nitroGLYCERIN, ondansetron (ZOFRAN) IV    BP 100/69  Pulse 98  Temp(Src) 97.5 F (36.4 C) (Oral)  Resp 14  Ht  (1.651 m)  Wt 51 kg (112 lb 7 oz)  BMI 18.71 kg/m2  SpO2 95%   PPS: 10 %   Intake/Output Summary (Last 24 hours) at 08/22/2013 1731 Last data filed at 08/29/2013 1150  Gross per 24 hour  Intake      0 ml  Output      0 ml  Net      0 ml   Physical Exam:  General: Moderate distress, brow furrowed, diaphoretic but cold, some mottling of limbs and circumoral cyanosis HEENT:  Pupils not examined, mm dry CVS: irregular,S1, S2 Abd: soft , no grimace Ext: cool, mottled, no edema Neuro: not opening eyes, grimacing   Labs: CBC    Component Value Date/Time   WBC 16.4* 09/20/2013 0037   RBC 3.08* 09/20/2013 0037   HGB 10.3* 09/20/2013 0037   HCT 31.5* 09/20/2013 0037   PLT 366 09/20/2013 0037   MCV 102.3* 09/20/2013 0037   MCH 33.4 09/20/2013 0037   MCHC 32.7 09/20/2013 0037   RDW 20.9* 09/20/2013 0037   LYMPHSABS 1.3 09/08/2013 1143   MONOABS 1.5* 09/07/2013 1143   EOSABS 0.2 09/10/2013 1143   BASOSABS 0.0 09/19/2013 1143      CMP     Component Value Date/Time   NA 136* 09/20/2013 0037   K 5.1 09/20/2013 0037   CL 98 09/20/2013 0037   CO2 22 09/20/2013 0037   GLUCOSE 154* 09/20/2013 0037   BUN 26* 09/20/2013 0037   CREATININE 1.31* 09/20/2013 0037   CALCIUM 9.6 09/20/2013 0037   GFRNONAA 33* 09/20/2013 0037   GFRAA 38* 09/20/2013 0037    Chest Xray Reviewed/Impressions:1. Lower inspiratory volumes with slight interval progression of  left basilar opacity likely representing a small effusion and  associated atelectasis.  2. Stable cardiomegaly.  3. Aortic atherosclerosis.  4. Pulmonary vascular congestion without overt edema.   Echo:  Left ventricle: The cavity size was normal. Wall thickness was increased in a pattern of mild LVH. Systolic function  was severely reduced. LVEF is 10-15% Doppler parameters are consistent with restrictive physiology, indicative of decreased left ventricular diastolic compliance and/or increased left atrial pressure. - Regional wall motion abnormality: Akinesis of the mid-apical anterior, mid anteroseptal, mid anterolateral, apical septal, apical lateral, and apical myocardium; hypokinesis of the basal anteroseptal myocardium. There is apical akinesis without clear evidence of thrombus.   Time In Time Out Total Time Spent with Patient  Total Overall Time  420 p 530 pm (600 pm)  70 min    Greater than 50%  of this time was spent counseling and coordinating care related to the above assessment and plan.  Lucresia Simic L. Ladona Ridgel, MD MBA The Palliative Medicine Team at [facility] Phone: [phone] Pager: [phone] ( Use team phone after hours)

## 2013-09-22 NOTE — Telephone Encounter (Signed)
Thank you for letting me know

## 2013-09-22 NOTE — Progress Notes (Signed)
@   1610Called into room by pt family, found pt with labored breathing, tachypnea, Respirations 40s. Paged [facility]. Updated SIMMONS, BRITTAINY of pt status. No New orders received. Upon speaking to B.Sharol Harness, [place]. Dr. Derenda Mis with Palliative Care  Arrived at bedside to assess pt @ ~1624  Dr. Derenda Mis updated and spoke with family members on pt declining status. New orders written to aid in pt comfort. Dr.Melissa stayed on unit to assist with pt progress. Pt continue to decline. Pt pronounced dead at 1715 by Dr. Derenda Mis and Challis Brya Simerly,RN

## 2013-09-22 NOTE — Telephone Encounter (Signed)
FYI for Dr Nicki Guadalajara.  Wanted to advise this patient passed away yesterday and she will take care of the Death Certificate.

## 2013-09-22 NOTE — Clinical Documentation Improvement (Signed)
Possible Clinical Conditions?   Chronic Systolic Congestive Heart Failure Chronic Diastolic Congestive Heart Failure Chronic Systolic & Diastolic Congestive Heart Failure Acute Systolic Congestive Heart Failure Acute Diastolic Congestive Heart Failure Acute Systolic & Diastolic Congestive Heart Failure Acute on Chronic Systolic Congestive Heart Failure Acute on Chronic Diastolic Congestive Heart Failure Acute on Chronic Systolic & Diastolic Congestive Heart Failure Other Condition Cannot Clinically Determine   Risk Factors: Patient with a history of CHF per 8/29 progress notes. CHF, will give a dose of IV lasix and follow renal function per 8/30 progress notes.   Thank You, Marciano Sequin, Clinical Documentation Specialist:  [phone]  [facility]- Health Information Management

## 2013-09-22 NOTE — Progress Notes (Signed)
Patient ID:Autumn Stone      DOB: 1912/06/28      LNL:[id number]  Patient experienced PEA arrest while I was at the bedside. Family supported through the process.. Time of Death 515 pm confirmed by two persons- this MD and Patients RN.  PEA on monitor.  No palpable pulse, no respirations, cyanotic, eye partially open.    Chaplain called.  Kary Colaizzi L. Ladona Ridgel, MD MBA The Palliative Medicine Team at [facility] Phone: [phone] Pager: [phone] ( Use team phone after hours)

## 2013-09-22 DEATH — deceased

## 2013-09-24 NOTE — ED Provider Notes (Signed)
CSN: [id number]     Arrival date & time 09/05/2013  1137 History   First MD Initiated Contact with Patient 09/18/2013 1154     Chief Complaint  Patient presents with  . Code STEMI      HPI  Patient presents via EMS with complaint of chest pain. Has EKG changes.  Lives at a skilled nursing facility. Parents complaint chest pain for the last 2 hours. Arrival she can tell me "I don't feel good". She puts and in the center of her chest indicates that she hurts. She has a history of dementia. Details are otherwise limited.  Past Medical History  Diagnosis Date  . Atrial fibrillation   . Alzheimer disease   . Hyperlipemia   . Hypertension    Past Surgical History  Procedure Laterality Date  . Corneal transplant     History reviewed. No pertinent family history. History  Substance Use Topics  . Smoking status: Never Smoker   . Smokeless tobacco: Not on file  . Alcohol Use: Not on file   OB History   Grav Para Term Preterm Abortions TAB SAB Ect Mult Living                 Review of Systems  Unable to perform ROS: Dementia      Allergies  Codeine  Home Medications   Prior to Admission medications   Medication Sig Start Date End Date Taking? Authorizing Provider  acetaminophen (TYLENOL) 500 MG tablet Take 1,000 mg by mouth 3 (three) times daily.   Yes Historical Provider, MD  aspirin EC 81 MG tablet Take 81 mg by mouth daily.   Yes Historical Provider, MD  atenolol (TENORMIN) 50 MG tablet Take 50 mg by mouth daily.   Yes Historical Provider, MD  Calcium Carbonate-Vitamin D (CALTRATE 600+D) 600-400 MG-UNIT per tablet Take 1 tablet by mouth 2 (two) times daily.   Yes Historical Provider, MD  furosemide (LASIX) 20 MG tablet Take 20 mg by mouth daily.   Yes Historical Provider, MD  levothyroxine (SYNTHROID, LEVOTHROID) 50 MCG tablet Take 50 mcg by mouth daily. On an empty stomach   Yes Historical Provider, MD  lisinopril (PRINIVIL,ZESTRIL) 10 MG tablet Take 10 mg by mouth daily.    Yes Historical Provider, MD  Multiple Vitamin (MULTIVITAMIN) tablet Take 1 tablet by mouth daily.   Yes Historical Provider, MD  potassium chloride SA (K-DUR,KLOR-CON) 20 MEQ tablet Take 20 mEq by mouth daily.   Yes Historical Provider, MD  traMADol (ULTRAM) 50 MG tablet Take 50 mg by mouth every 6 (six) hours as needed for moderate pain.   Yes Historical Provider, MD   BP 100/69  Pulse 98  Temp(Src) 97.5 F (36.4 C) (Oral)  Resp 14  Ht 5\' 5"  (1.651 m)  Wt 114 lb 3.2 oz (51.8 kg)  BMI 19.00 kg/m2  SpO2 95% Physical Exam  Constitutional: She is oriented to person, place, and time. No distress.  Thin frail appearing elderly female  HENT:  Head: Normocephalic.  Eyes: Conjunctivae are normal. Pupils are equal, round, and reactive to light. No scleral icterus.  Neck: Normal range of motion. Neck supple. No thyromegaly present.  Cardiovascular: Normal rate and regular rhythm.  Exam reveals no gallop and no friction rub.   No murmur heard. No murmurs. No crackles rales or frank CHF.  Pulmonary/Chest: Effort normal and breath sounds normal. No respiratory distress. She has no wheezes. She has no rales.  Clear lungs  Abdominal: Soft. Bowel sounds are  normal. She exhibits no distension. There is no tenderness. There is no rebound.  Musculoskeletal: Normal range of motion.  Neurological: She is alert and oriented to person, place, and time.  Skin: Skin is warm and dry. No rash noted.  Psychiatric: She has a normal mood and affect. Her behavior is normal.    ED Course  Procedures (including critical care time) Labs Review Labs Reviewed  CBC WITH DIFFERENTIAL - Abnormal; Notable for the following:    RBC 2.80 (*)    Hemoglobin 9.5 (*)    HCT 28.7 (*)    MCV 102.5 (*)    RDW 20.7 (*)    Monocytes Relative 14 (*)    Monocytes Absolute 1.5 (*)    All other components within normal limits  BASIC METABOLIC PANEL - Abnormal; Notable for the following:    Glucose, Bld 133 (*)    BUN 24  (*)    Creatinine, Ser 1.29 (*)    GFR calc non Af Amer 33 (*)    GFR calc Af Amer 39 (*)    All other components within normal limits  PROTIME-INR - Abnormal; Notable for the following:    Prothrombin Time 16.2 (*)    All other components within normal limits  TROPONIN I - Abnormal; Notable for the following:    Troponin I 14.19 (*)    All other components within normal limits  TROPONIN I - Abnormal; Notable for the following:    Troponin I >20.00 (*)    All other components within normal limits  BASIC METABOLIC PANEL - Abnormal; Notable for the following:    Sodium 136 (*)    Glucose, Bld 154 (*)    BUN 26 (*)    Creatinine, Ser 1.31 (*)    GFR calc non Af Amer 33 (*)    GFR calc Af Amer 38 (*)    Anion gap 16 (*)    All other components within normal limits  CBC - Abnormal; Notable for the following:    WBC 16.4 (*)    RBC 3.08 (*)    Hemoglobin 10.3 (*)    HCT 31.5 (*)    MCV 102.3 (*)    RDW 20.9 (*)    All other components within normal limits  LIPID PANEL - Abnormal; Notable for the following:    LDL Cholesterol 120 (*)    All other components within normal limits  I-STAT CHEM 8, ED - Abnormal; Notable for the following:    Sodium 136 (*)    BUN 28 (*)    Creatinine, Ser 1.40 (*)    Glucose, Bld 136 (*)    Calcium, Ion 1.12 (*)    Hemoglobin 10.2 (*)    HCT 30.0 (*)    All other components within normal limits  MRSA PCR SCREENING  TROPONIN I  I-STAT TROPOININ, ED    Imaging Review No results found.   EKG Interpretation   Date/Time:  Saturday 2013-09-25 11:43:45 EDT Ventricular Rate:  64 PR Interval:  224 QRS Duration: 109 QT Interval:  425 QTC Calculation: 438 R Axis:   -72 Text Interpretation:  Sinus rhythm Atrial premature complex Prolonged PR  interval Probable left ventricular hypertrophy Inferior infarct, acute  Anterior Q waves, possibly due to LVH ED PHYSICIAN INTERPRETATION  AVAILABLE IN [facility] Confirmed by TEST, Record  ([id number]) on  09/15/2013 7:50:07 AM      MDM   Final diagnoses:  Acute anterior wall MI  Dementia, without behavioral  disturbance  Essential hypertension  Cardiomyopathy, ischemic  Atrial fibrillation, unspecified  Palliative care encounter    She is seen by myself upon arrival. Care discussed at length with Dr. Excell Seltzer. I was present upon Dr. Earmon Phoenix phone discussion with the patient's family/power of attorney. Currently no interventions are planned other than medical therapy and comfort measures. I am in complete agreement with this plan.    Rolland Porter, MD 09/24/13 [phone]

## 2013-10-22 DEATH — deceased

## 2014-05-15 NOTE — H&P (Signed)
PATIENT NAME:  Autumn Stone, Autumn Stone MR#:  [id number] DATE OF BIRTH:  16-Aug-1912  DATE OF ADMISSION:  06/17/2013  PRIMARY CARE PHYSICIAN: None.   HISTORY OF PRESENT ILLNESS: Apparently, patient was recently moved by her family to [facility] assisted living facility and she has no primary care physician at this point yet. She was brought to the Emergency Room from assisted living facility since she fell down yesterday and she had somewhat altered mental status. She was brought to the Emergency Room since her mental status did not improve. She was noted to have elevated white blood cell count and some hypoxia with O2 saturations of 92% on room air. Her chest x-ray revealed possible pneumonia on the right side and hospitalist services were contacted for admission.  At baseline , when patient feels okay,  patient usually walks with a walker. She does have some mild dementia, however, is not significant according to the patient's family who is present during my interview. The patient was complaining of left posterior superior  sacral iliac area pain on arrival to the Emergency Room, so she underwent numerous x-rays to rule out fracture as well.    PAST MEDICAL HISTORY:  Significant for history of hypertension, osteoporosis, hypothyroidism, mild dementia, also CHF and chronic venous insufficiency based on physical exam.   MEDICATIONS: According to medical records, the patient is on aspirin 81 mg daily, atenolol 50 mg daily, Caltrate 600 mg with vitamin D 1 tablet once daily, furosemide 40 mg p.o. daily, Levaquin 250 mg p.o. daily.  Apparently this medication has been ordered by Emergency Room physician when the Emergency Room physician planned to discharge patient. Levothyroxine 50 mcg p.o. daily, multivitamins once daily, and potassium chloride 20 mg once daily.   ALLERGIES: No known drug allergies.   PAST SURGICAL HISTORY: Right mastectomy due to breast carcinoma 10 or 12 years ago.   FAMILY HISTORY: Positive  for hypertension. No diabetes or cancers. No other chronic medical problems.   SOCIAL HISTORY: The patient is widowed since approximately 11 years ago. Lives in an assisted living facility in  [place] . No smoking. Occasional wine, according to patient's family. In the past, she was a Public librarian.   REVIEW OF SYSTEMS:   Not available as the patient is confused.   PHYSICAL EXAMINATION:  VITAL SIGNS: On arrival to the hospital, temperature is 97.9, pulse was 81 , respiration rate was 18, blood pressure 154/70, and saturation was 92% on room air.  GENERAL: This is a well-developed, well-nourished, thin, Caucasian female confused, sitting on the stretcher.  HEENT: Pupils equal, round, reactive to light. Extraocular movements intact. No icterus or conjunctivitis. Has normal hearing. No pharyngeal erythema.  Mucosa is dry.  NECK:  No masses. Supple, nontender. Thyroid is not enlarged. No adenopathy. No JVD or carotid bruits bilaterally. Full range of motion.  LUNGS: Clear to auscultation, but patient is not taking deep breath  as she is very weak. I am not able to lift her up and listen to her posteriorly. However, in lateral aspects on her lungs, lungs seem to be opening quite well and I do not hear any crackles or wheezes or any other abnormalities, and no labored inspirations, increased effort, or dullness to percussion. Not in overt respiratory distress.  CARDIOVASCULAR: S1, S2 appreciated. Patient has 3/6 systolic murmur which was radiating to the left axilla and no radiation to the neck. PMI not lateralized.  Chest is nontender to palpation. 1+ pedal pulses.  EXTREMITIES: No lower extremity edema,  calf tenderness, or cyanosis. Chronic venous insufficiency  brown discoloration skin changes of her lower extremities was noted.  ABDOMEN: Soft, nontender. Bowel sounds are present. No hepatosplenomegaly or masses were noted.  RECTAL: Deferred.  MUSCLE STRENGTH: The patient is able to move  extremities; however, very poorly compliant and no stenosis.  Not able to assess her for kyphosis. However, based on her slumpiness in the bed she probably has some underlying kyphosis as well. Gait is not tested.  SKIN: Revealed brownish discoloration in the lower extremities which is chronic. No other rashes, lesions, erythema, nodularity, or induration. Skin was warm and dry to palpation.  LYMPHATIC: No adenopathy in cervical region.  NEUROLOGICAL: Cranial nerves grossly intact. Sensory difficult to obtain due to confusion, however, grossly intact. No dysarthria or aphasia. The patient is alert, not cooperative. Memory is impaired. She is confused, but no agitation or depression noted.   LABORATORY DATA: BMP showed glucose of 176, BUN of 29, potassium of 3.3, bicarbonate level CO2 is 33. The patient's estimated GFR would be 34. Otherwise BMP was unremarkable. The patient's albumin level of 2.8, total bilirubin was 1.3. Otherwise, liver enzymes were normal. Cardiac enzymes are less than 0.02 troponin. Her TSH was normal at 1.6. White cell count is elevated to 17.3, hemoglobin 10.3, platelet count 264,000.  Absolute neutrophil count 15.1.  Her urinalysis, yellow, hazy urine, negative for glucose, bilirubin, or ketones. Specific gravity 1.016, pH was 6.0, 1+ blood, 30  mg/dl protein, negative for nitrites or leukocyte esterase, 7 red blood cells, 3 white blood cells. No bacteria, less than 1 epithelial cell.  Mucus was present as well as 2 hyaline casts.   RADIOLOGIC STUDIES: Left hip complete x-ray, 05/27/ 2015, showed symmetric osteoarthritic changes in both hip joints. No fracture or dislocation. Chest portable x-ray, 06/17/2013, showed changes in left lung, likely related to small effusion and early infiltrate atelectasis. Lumbar spine, AP and lateral, 06/17/2013, revealed scoliosis and multilevel osteoarthritic changes, areas of spondylolisthesis at L2-L3 and L3-L4 felt to be due to underlying  spondylosis. No fracture or atherosclerotic changes. CT scan of head without contrast on 06/17/2013 revealed no acute intracranial abnormality, moderate atrophy, mild chronic microvascular ischemic changes, small old left PCA distribution infarct.  CT of left hip without contrast on 06/17/2013 showed no acute findings. Mild left hip osteoarthritis.   ASSESSMENT AND PLAN:  1. Altered mental status of unclear etiology. Get arterial blood gas as the patient's CO2 is elevated on basic metabolic panel.  I want to rule out CO2 retention. We will also get ammonia level checked and follow the patient clinically.  2. Mild dehydration. We will hold patient's diuretics.  3. Hypertension. We will continue the patient on metoprolol and we will add lisinopril following patient's blood pressure readings.  4. Systemic inflammatory response syndrome likely due to pneumonia as well as urinary tract infection. We will get urine cultures, blood cultures, as well as if possible sputum cultures.  We will initiate the patient on Levaquin and we will follow culture results.  5. Suspected pneumonia. We will initiate patient on antibiotic therapy with Levaquin as mentioned above. We will follow culture results.  6. Questionable urinary tract infection. Get urine cultures. Continue antibiotics for now.  7. Leukocytosis. Follow with antibiotic therapy.  8. History of congestive heart failure , stable, follow with therapy.  9. Hypothyroidism, the patient's TSH is normal. We will continue Synthroid.  10. History of osteoporosis. We will continue calcium with vitamin D.  11. History of  dementia.  We will continue supportive therapy.   TIME SPENT: 50 minutes on this patient.    ____________________________ Katharina Caper, MD rv:dd D: 06/17/2013 17:10:37 ET T: 06/17/2013 17:37:03 ET JOB#: [id number]  cc: Katharina Caper, MD, <Dictator> Herlinda Heady MD ELECTRONICALLY SIGNED 07/18/2013 17:17

## 2014-05-15 NOTE — H&P (Signed)
PATIENT NAME:  Autumn Stone, Autumn Stone MR#:  [id number] DATE OF BIRTH:  04/15/1912  DATE OF ADMISSION:  06/17/2013  ADDENDUM:  Upon further discussion with the patient's son, it appears that the patient does have history of atrial fibrillation and this EKG which was done here in the hospital does not show any new condition. EKG revealed atrial fibrillation with rate of 80 beats per minute, left axis deviation but no acute ST-T changes. No EKGs are available for comparison. However, as discussed with patient's son, this condition carries high risk of stroke and, unfortunately, since the patient presented with altered mental status, workup of stroke is also going to be indicated if her condition does not improve with simple measures. Meanwhile, we are going to continue aspirin therapy and will discuss with family depending on her progression and her ability to walk and risks of falls, possibly anticoagulation if they feel this is something that they would like to do.    TIME SPENT: 50 minutes.    ____________________________ Katharina Caper, MD rv:cs D: 06/17/2013 17:14:51 ET T: 06/17/2013 18:01:02 ET JOB#: [id number]  cc: Katharina Caper, MD, <Dictator> Telsa Dillavou MD ELECTRONICALLY SIGNED 07/18/2013 17:06

## 2014-05-15 NOTE — Discharge Summary (Signed)
PATIENT NAME:  Autumn Stone, Autumn Stone MR#:  [id number] DATE OF BIRTH:  [birth year]  DATE OF ADMISSION:  06/17/2013 DATE OF DISCHARGE: 06/19/2013   For a detailed note, please take a look at the history and physical done on admission by Dr. Winona Legato.  DIAGNOSES AT DISCHARGE:  1.  Altered mental status/encephalopathy secondary to possible urinary tract infection. 2.  Underlying dementia and generalized weakness due to deconditioning and underlying dementia. 3.  Back pain.  4.  Hypertension. 5.  Urinary tract infection.  6.  Hypothyroidism.   DIET: The patient is being discharged on a low-sodium diet.   ACTIVITY: As tolerated.   FOLLOWUP: With primary care physician at the skilled nursing facility.   DISCHARGE MEDICATIONS: Aspirin 81 mg daily; atenolol 50 mg daily; Synthroid 50 mcg daily; multivitamin daily; potassium 20 mEq daily; Caltrate 1 tab b.i.d.; Lasix 20 mg daily; Tylenol with hydrocodone 5/325, 1 tab q.4 hours as needed; tramadol 50 mg q.6 hours as needed; lisinopril 2.5 mg daily and Levaquin 250 mg daily x 3 days.   PERTINENT STUDIES DONE DURING THE HOSPITAL COURSE: CT scan of the head done without contrast on admission showing no acute intracranial abnormalities and moderate atrophy. CT of the head done without contrast showing no acute findings, mild left hip osteoarthritis. A chest x-ray done on admission showing changes in the left lung base, there is small effusion and early infiltrate or atelectasis. X-ray of the lumbar spine showing scoliosis and multilevel osteoarthritic change. An x-ray of the left hip showing symmetric osteoarthritic changes in both hip joints. No fracture or dislocation.    HOSPITAL COURSE: This is a 81 year old female, who presented to the hospital on Jun 17, 2013 due to weakness and altered mental status.  1.  Altered mental status/encephalopathy. The exact etiology of this is unclear, but suspected to be encephalopathy secondary to dementia complicated with  possible UTI. The patient was also thought to have a possible pneumonia, which could also have contributed to her mental status change. The patient was empirically started on IV Levaquin for treatment of possible UTI and pneumonia. The patient was also given gentle IV fluid hydration. After getting IV antibiotics and IV fluids, the patient's mental status has improved and is almost close to baseline. There was no evidence of any acute neurologic pathology. The patient's CT scan of the head on admission was negative.  2.  Generalized weakness and deconditioning. This is likely related to underlying dementia complicated with underlying UTI. Also probably related to advanced age and deconditioning. The patient was seen by physical therapy and thought she would benefit from short-term rehab services, which is where she is presently being discharged.  3.  Back pain. The exact etiology of the back pain is unclear. The patient denied having a fall prior to coming into the hospital, although it this is unclear. The patient had no bruising on physical exam. Her x-ray of her lumbar spine a CT scan of her hips were negative for any acute bony abnormality other than osteoarthritis. At this point, we are continuing supportive care with p.r.n. tramadol and Norco as stated.  4.  Suspected urinary tract infection. The patient was treated with IV Levaquin and is currently being discharged on oral Levaquin for a few days. Urine culture has been negative.  5.  Hypertension. The patient has remained hemodynamically stable. She will continue her atenolol and lisinopril as stated.  6.  Hypothyroidism. The patient was maintained on her Synthroid. She will resume that.  7.  History of congestive heart failure. The patient was not noted to be clinically in congestive heart failure. She will continue her low-dose Lasix and beta blocker as stated along with low-dose ACE inhibitor.   CODE STATUS: The patient is a DNI/DNR.    DISPOSITION: She is being discharged to a skilled nursing facility.   TIME SPENT: 40 minutes.  ____________________________ Rolly Pancake. Cherlynn Kaiser, MD vjs:aw D: 06/19/2013 13:54:21 ET T: 06/19/2013 14:08:38 ET JOB#: [id number]  cc: Rolly Pancake. Cherlynn Kaiser, MD, <Dictator> Houston Siren MD ELECTRONICALLY SIGNED 06/27/2013 21:48

## 2016-01-21 IMAGING — CT CT OF THE LEFT HIP WITHOUT CONTRAST
2 of 5 series · 16 of 46 positions shown, 19 images · non-contrast
Comparison: Plain films left hip earlier this same day.

CLINICAL DATA: Status post fall.  Left hip and sacral pain.

EXAM:
CT OF THE LEFT HIP WITHOUT CONTRAST
TECHNIQUE: Multidetector CT imaging was performed according to the standard
protocol. Multiplanar CT image reconstructions were also generated.

[Series 2: hip soft tissue · axial · 0.39mm/px · z∈[-1178,-1016]mm · 13 of 120 slices shown, 16 images]
[im 8/120  soft-tissue]
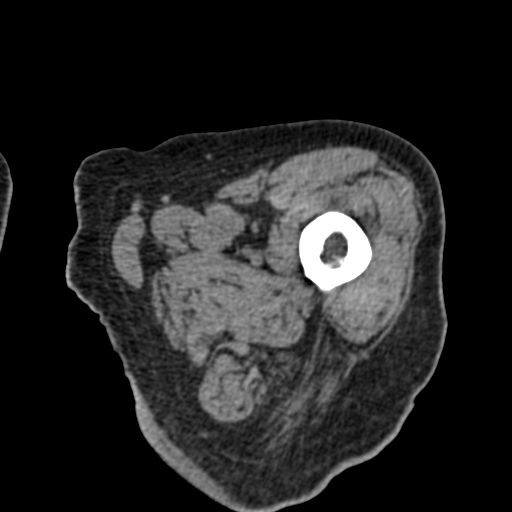
[im 8/120  bone]
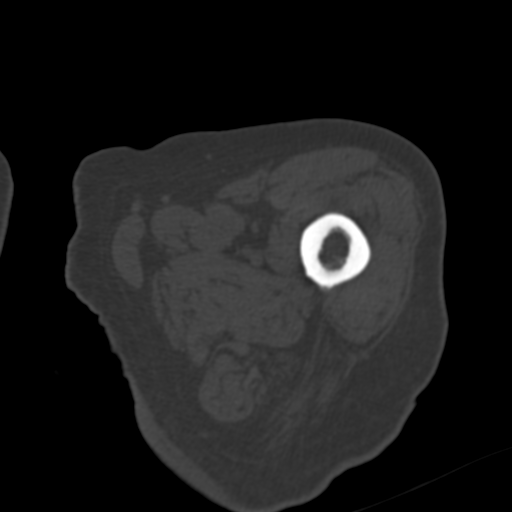
[im 20/120  soft-tissue]
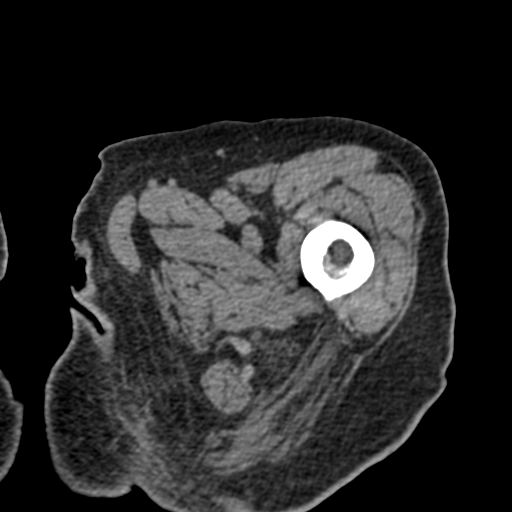
[im 31/120  soft-tissue]
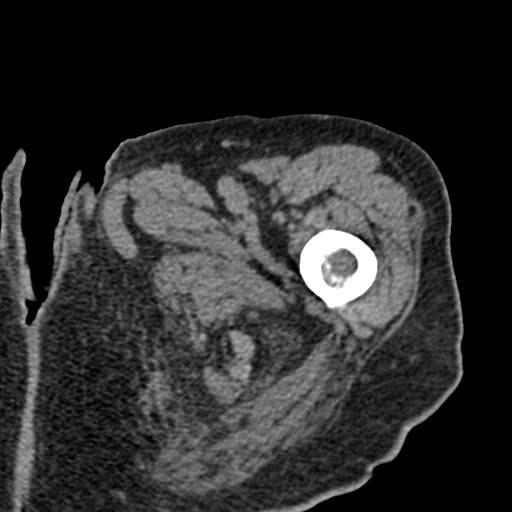
[im 43/120  soft-tissue]
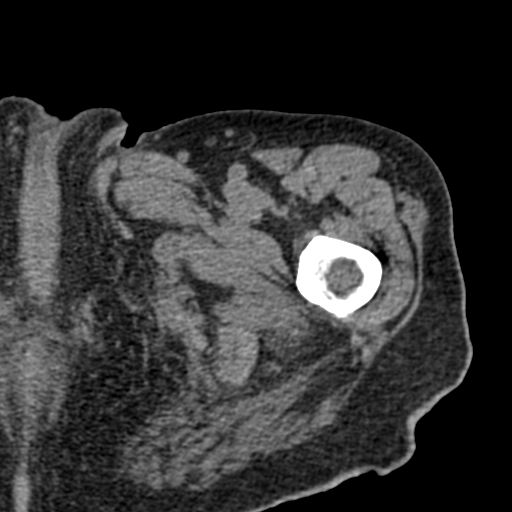
[im 54/120  soft-tissue]
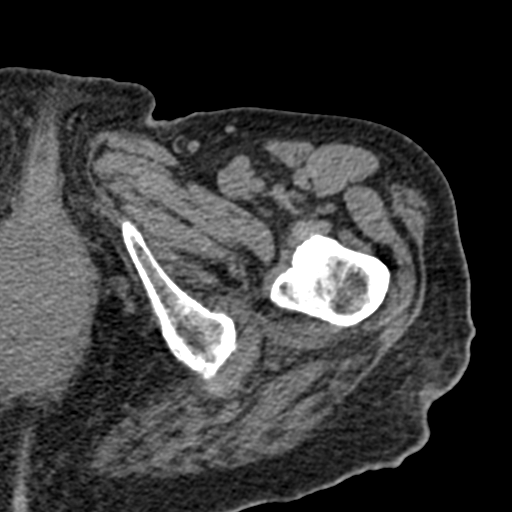
[im 66/120  soft-tissue]
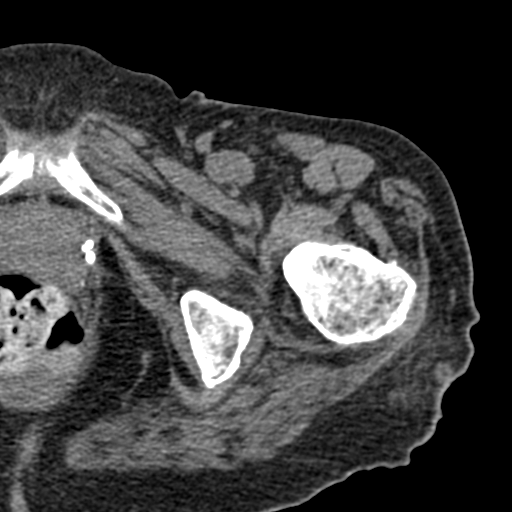
[im 77/120  soft-tissue]
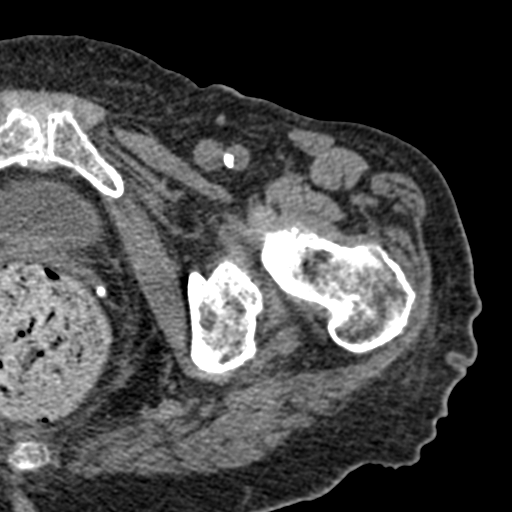
[im 89/120  soft-tissue]
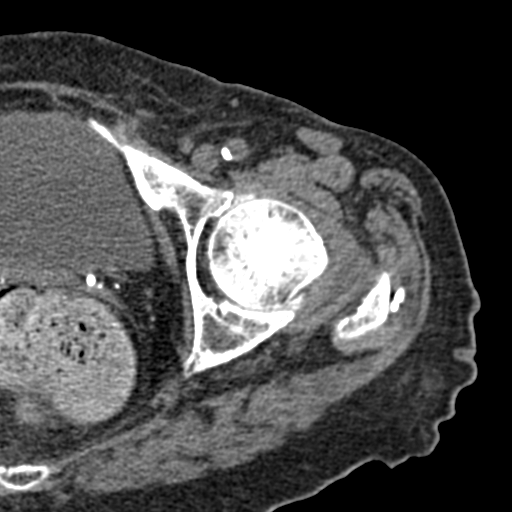
[im 100/120  soft-tissue]
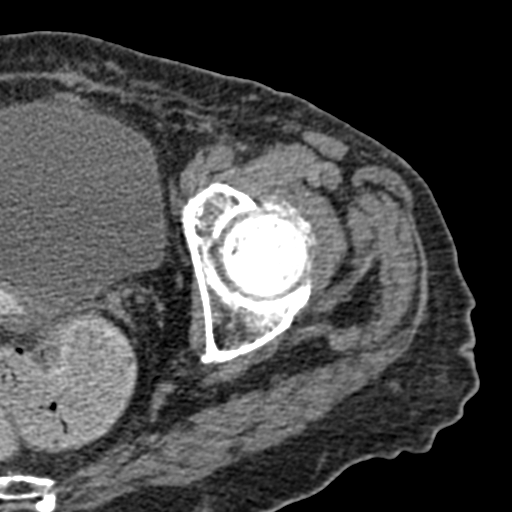
[im 100/120  bone]
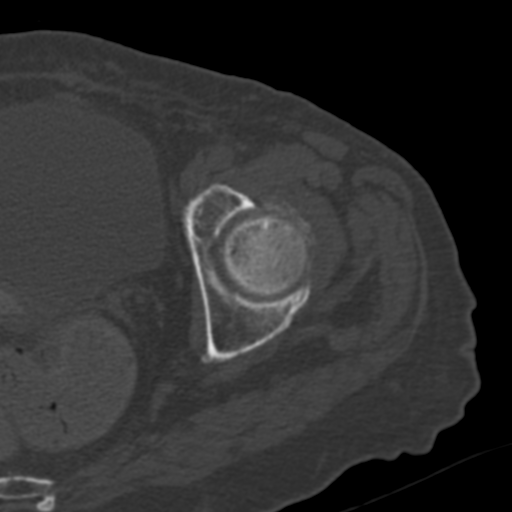
[im 104/120  lung]
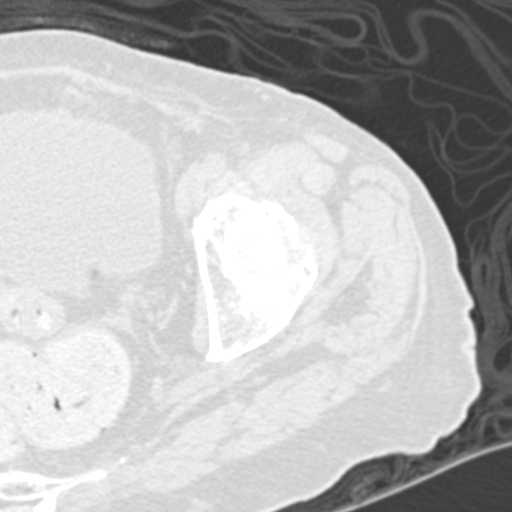
[im 108/120  lung]
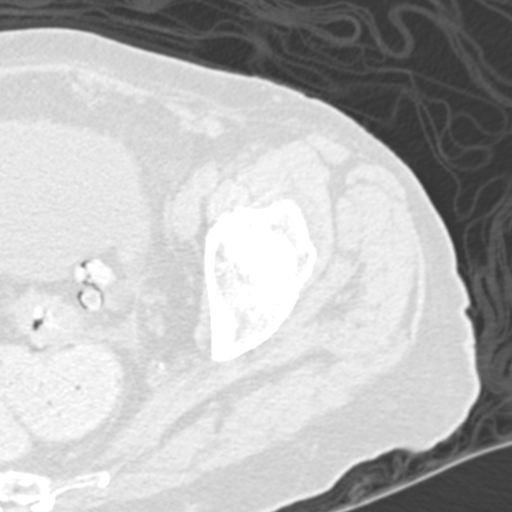
[im 112/120  soft-tissue]
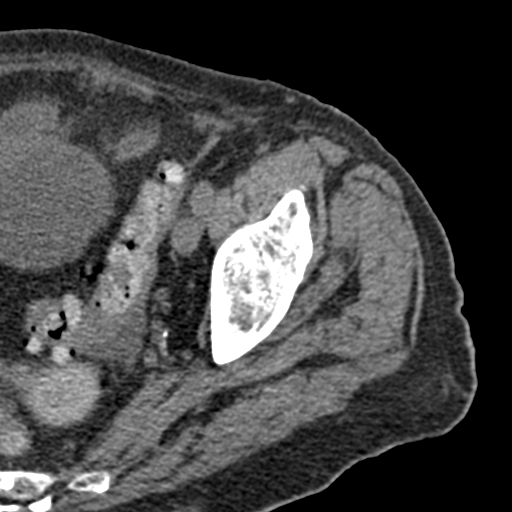
[im 112/120  lung]
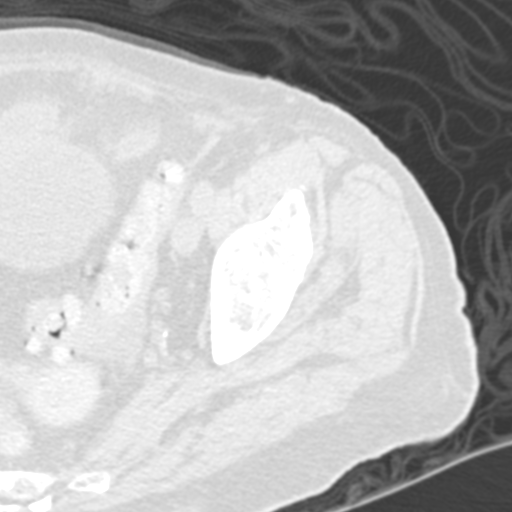
[im 116/120  lung]
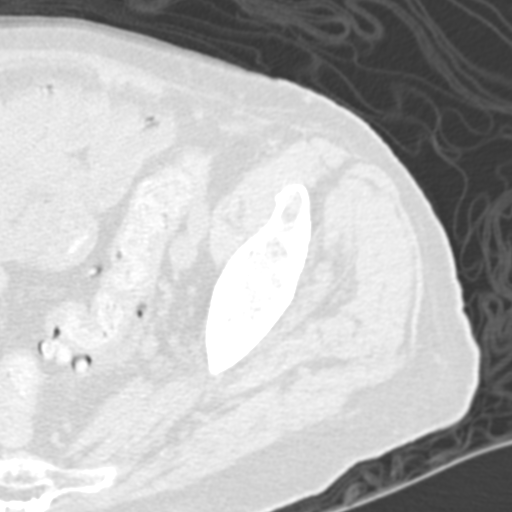

[Series 5: hip soft tissue cor · coronal · 0.33mm/px · 3 of 93 slices shown]
[im 19/93  soft-tissue]
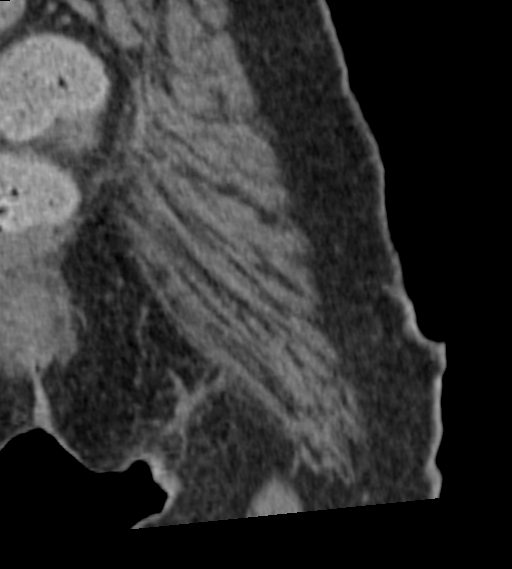
[im 37/93  soft-tissue]
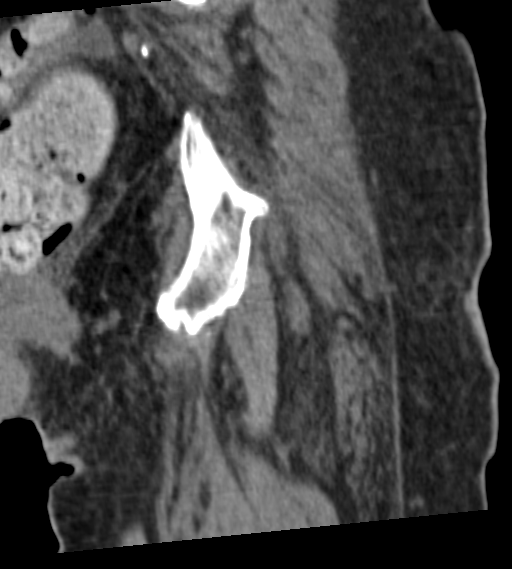
[im 56/93  soft-tissue]
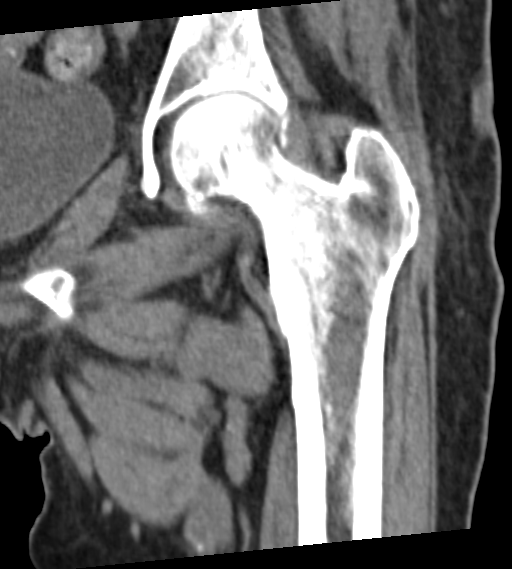

[16 of 46 positions shown; findings below may reference images not displayed]

FINDINGS: The left hip is located. No fracture is identified. Mild appearing
degenerative change is seen about the left hip and symphysis pubis.
No hip joint effusion is identified. Mild enthesopathic change about
the greater trochanter is identified. There is some
chondrocalcinosis about the left hip.
IMPRESSION: No acute finding.

Mild left hip osteoarthritis.

## 2016-04-24 IMAGING — CR DG CHEST 1V PORT
1 series · 1 of 1 positions shown · non-contrast
Comparison: Prior chest x-ray 06/17/2013

CLINICAL DATA: Could STEMI

EXAM:
PORTABLE CHEST - 1 VIEW

[AP]
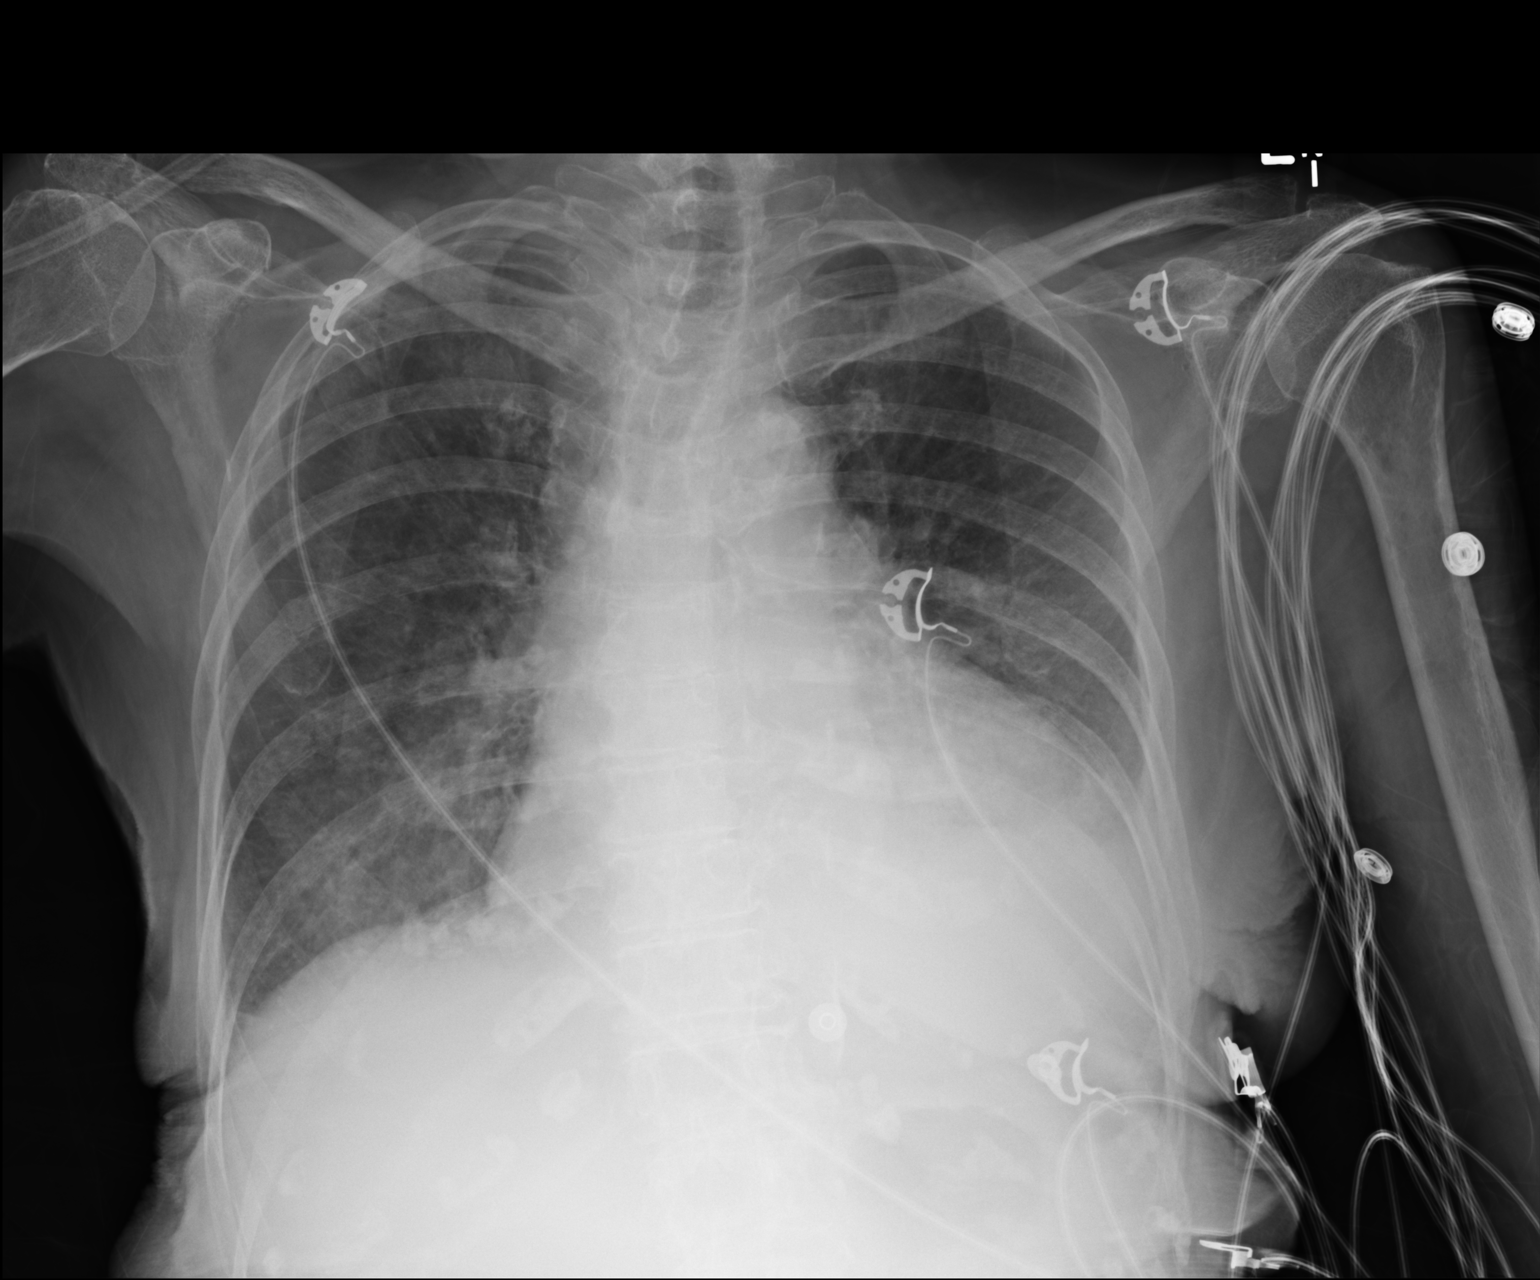

[1 of 1 positions shown; findings below may reference images not displayed]

FINDINGS: Stable cardiomegaly. Atherosclerotic calcifications present in the
transverse aorta. Mild vascular congestion without overt edema
appears similar compared to prior. Left basilar airspace opacity
slightly progressed compared to 06/17/2013. Inspiratory volumes are
slightly lower. No acute osseous abnormality. No pneumothorax.
Stable central bronchitic changes. No acute osseous abnormality.
IMPRESSION: 1. Lower inspiratory volumes with slight interval progression of
left basilar opacity likely representing a small effusion and
associated atelectasis.
2. Stable cardiomegaly.
3. Aortic atherosclerosis.
4. Pulmonary vascular congestion without overt edema.
